# Patient Record
Sex: Male | Born: 1965 | Race: White | Hispanic: No | Marital: Married | Smoking: Never smoker
Health system: Southern US, Community
[De-identification: ages and names within clinical notes are randomized; demographics above are authoritative.]

## PROBLEM LIST (undated history)

## (undated) DIAGNOSIS — I499 Cardiac arrhythmia, unspecified: Secondary | ICD-10-CM

## (undated) HISTORY — PX: HERNIA REPAIR: SHX51

## (undated) HISTORY — DX: Cardiac arrhythmia, unspecified: I49.9

---

## 2008-12-22 ENCOUNTER — Emergency Department (HOSPITAL_COMMUNITY): Admission: EM | Admit: 2008-12-22 | Discharge: 2008-12-23 | Payer: Self-pay | Admitting: Emergency Medicine

## 2010-07-02 IMAGING — US US ART/VEN ABD/PELV/SCROTUM DOPPLER COMPLETE
1 series · 13 of 25 positions shown · non-contrast
Comparison: None.

CLINICAL DATA: 42-year-old male with scrotal pain for 2 days.
History of prior vasectomy.  Known right sided "cyst".

SCROTAL ULTRASOUND
DOPPLER ULTRASOUND OF THE TESTICLES
TECHNIQUE: Complete ultrasound examination of the testicles,
epididymis, and other scrotal structures was performed.  Color and
spectral Doppler ultrasound were also utilized to evaluate blood
flow to the testicles.

[Series 1: us art/ven abd/pelv/scrotum doppler complete · 0.07mm/px · 13 of 60 slices shown]
[im 1/60]
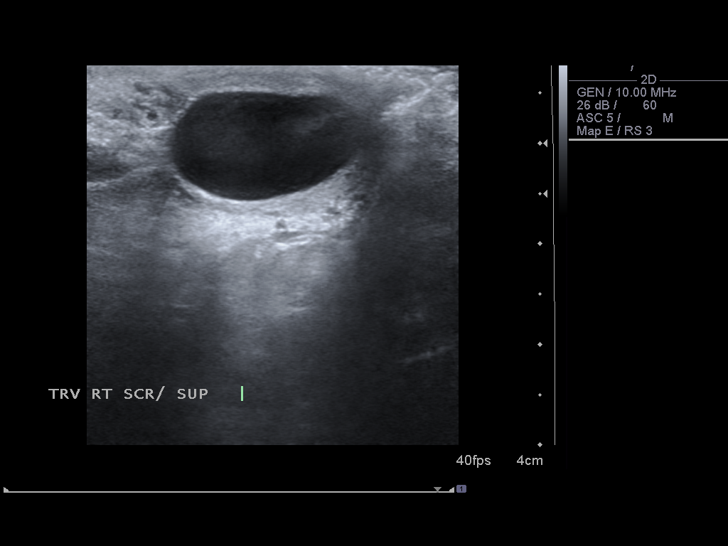
[im 5/60]
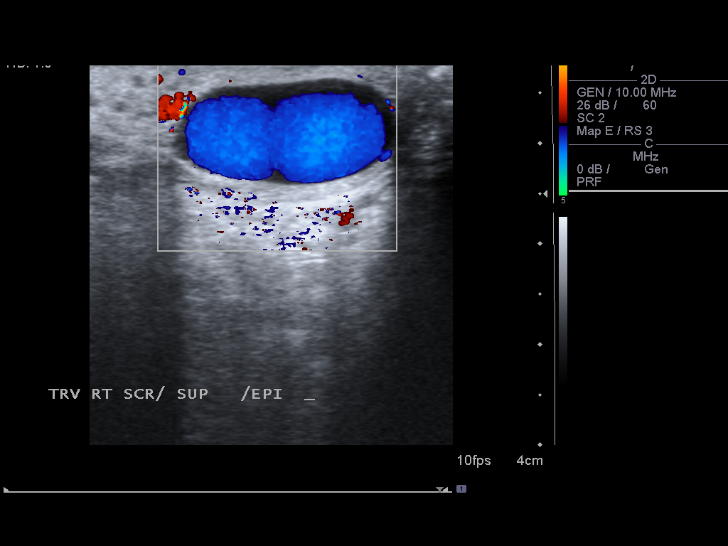
[im 10/60]
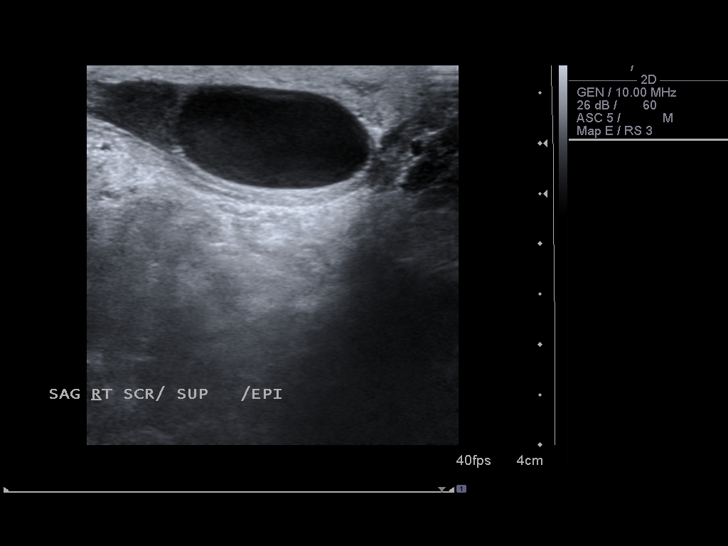
[im 15/60]
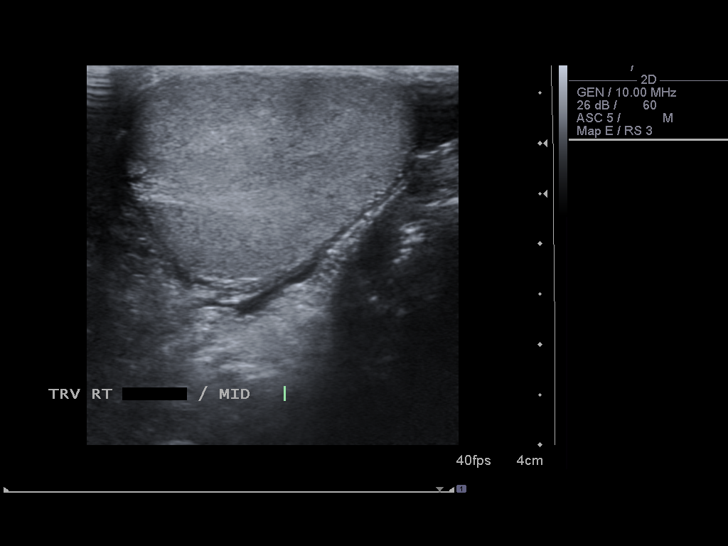
[im 20/60]
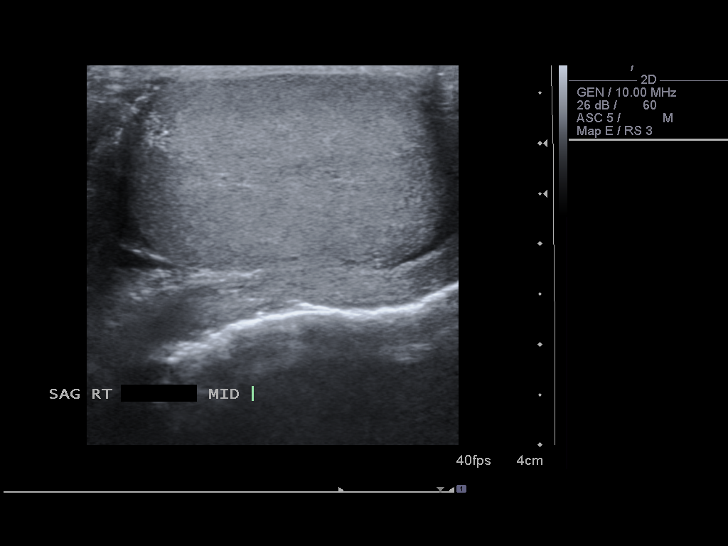
[im 25/60]
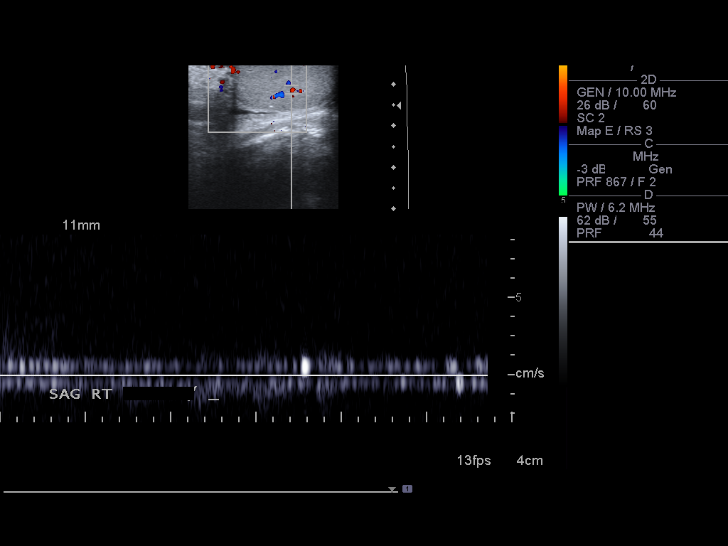
[im 30/60]
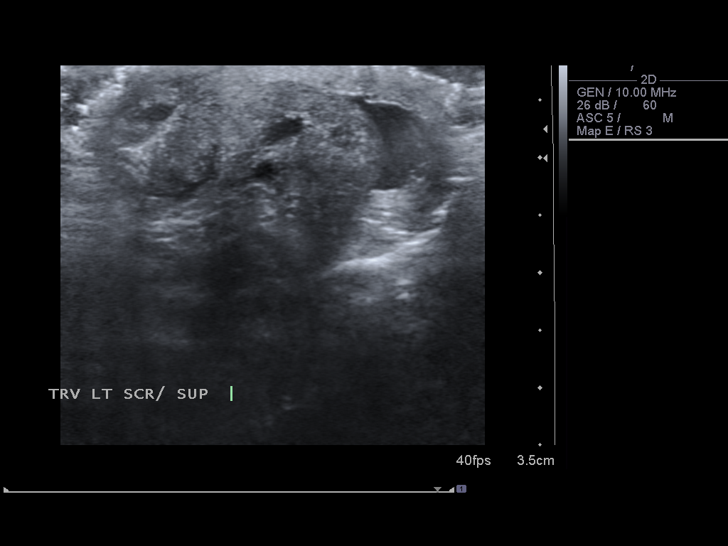
[im 35/60]
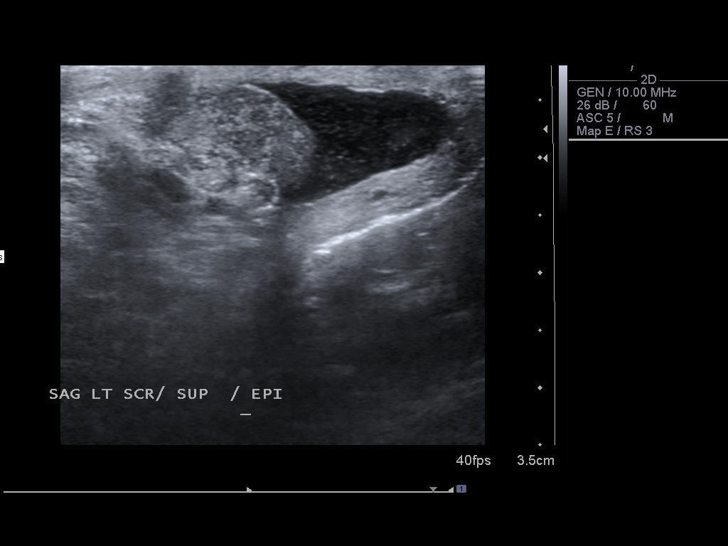
[im 40/60]
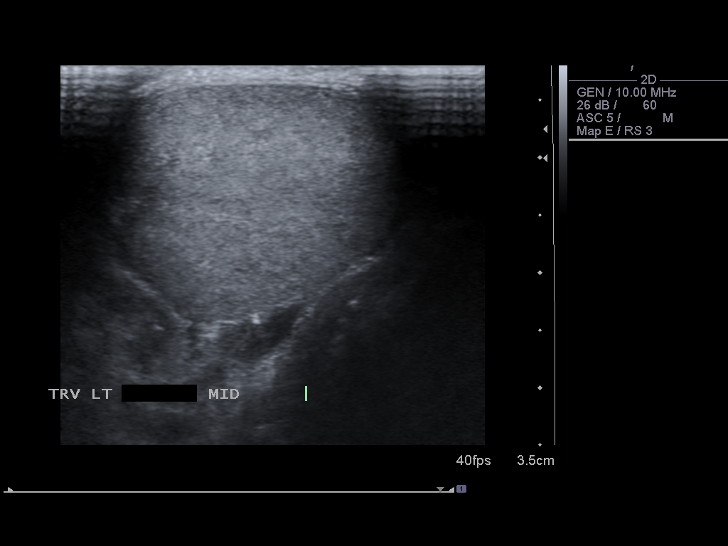
[im 45/60]
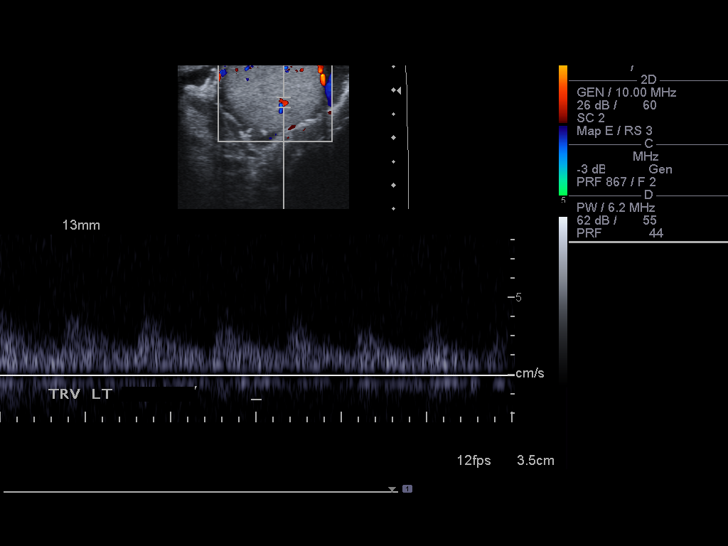
[im 50/60]
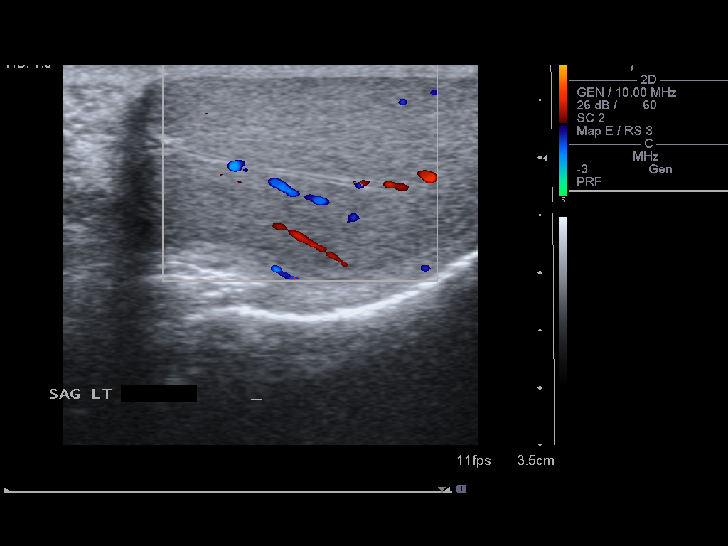
[im 55/60]
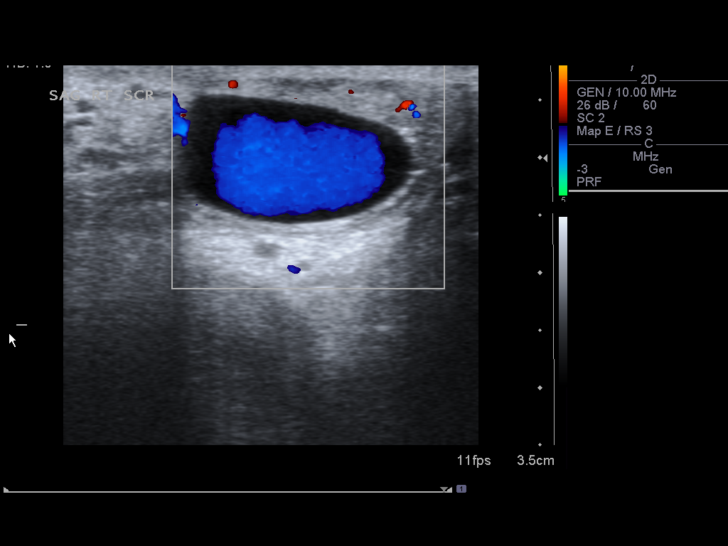
[im 60/60]
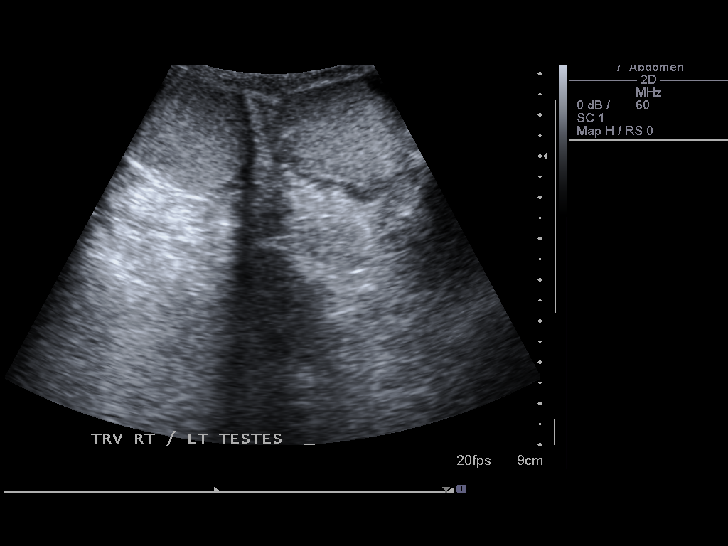

[13 of 25 positions shown; findings below may reference images not displayed]

FINDINGS: Bilateral testicular echotexture is within normal limits.
The right testicle measures 3.3 x 2.8 x 2.1 cm and the left
measures 3.3 x 2.5 x 2.0 cm.  Both testicles demonstrate normal
color Doppler flow and arterial, venous spectral wave forms.

The epididymi are within normal limits bilaterally.

There are bilateral complicated hydroceles, with echogenic mobile
debris seen during real time scanning.  The left hydrocele is
slightly larger than the right.

There is a circumscribed cystic appearing lesion adjacent to the
right testicle in the right hemi scrotum measuring 18 x 18 x 11 mm.
On color Doppler interrogation, this demonstrated a non pulsatile
flow.  Therefore, a large venous varix is favored.  No other
varices are evident.
IMPRESSION: 1.  No evidence of testicular torsion and normal testicular
echotexture.
2.  Complex left greater than right hydroceles/pyoceles with
echogenic debris.
3.  Unusual cystic lesion adjacent to the right testicle with
apparent venous flow.  Favor large venous varix.  According to the
patient, this abnormality has been seen on outside studies dating
back 6 years.  Comparison with those exams would be helpful.

## 2010-08-16 ENCOUNTER — Encounter: Payer: Self-pay | Admitting: Emergency Medicine

## 2010-11-02 LAB — URINALYSIS, ROUTINE W REFLEX MICROSCOPIC
Bilirubin Urine: NEGATIVE
Hgb urine dipstick: NEGATIVE
Ketones, ur: NEGATIVE mg/dL
Protein, ur: NEGATIVE mg/dL
Urobilinogen, UA: 0.2 mg/dL (ref 0.0–1.0)

## 2014-08-20 ENCOUNTER — Other Ambulatory Visit: Payer: Self-pay | Admitting: *Deleted

## 2014-08-20 ENCOUNTER — Encounter (HOSPITAL_BASED_OUTPATIENT_CLINIC_OR_DEPARTMENT_OTHER): Payer: Self-pay | Admitting: *Deleted

## 2014-08-20 ENCOUNTER — Emergency Department (HOSPITAL_BASED_OUTPATIENT_CLINIC_OR_DEPARTMENT_OTHER): Payer: BLUE CROSS/BLUE SHIELD

## 2014-08-20 ENCOUNTER — Emergency Department (HOSPITAL_BASED_OUTPATIENT_CLINIC_OR_DEPARTMENT_OTHER)
Admission: EM | Admit: 2014-08-20 | Discharge: 2014-08-20 | Disposition: A | Discharge status: Home / Self Care | Payer: BLUE CROSS/BLUE SHIELD | Attending: Emergency Medicine | Admitting: Emergency Medicine

## 2014-08-20 DIAGNOSIS — I499 Cardiac arrhythmia, unspecified: Secondary | ICD-10-CM

## 2014-08-20 DIAGNOSIS — I4891 Unspecified atrial fibrillation: Secondary | ICD-10-CM

## 2014-08-20 DIAGNOSIS — R079 Chest pain, unspecified: Secondary | ICD-10-CM | POA: Diagnosis present

## 2014-08-20 HISTORY — DX: Cardiac arrhythmia, unspecified: I49.9

## 2014-08-20 LAB — URINALYSIS, ROUTINE W REFLEX MICROSCOPIC
BILIRUBIN URINE: NEGATIVE
Glucose, UA: NEGATIVE mg/dL
Hgb urine dipstick: NEGATIVE
Ketones, ur: NEGATIVE mg/dL
LEUKOCYTES UA: NEGATIVE
Nitrite: NEGATIVE
PH: 6 (ref 5.0–8.0)
Protein, ur: NEGATIVE mg/dL
SPECIFIC GRAVITY, URINE: 1.019 (ref 1.005–1.030)
Urobilinogen, UA: 0.2 mg/dL (ref 0.0–1.0)

## 2014-08-20 LAB — COMPREHENSIVE METABOLIC PANEL
ALBUMIN: 4.4 g/dL (ref 3.5–5.2)
ALK PHOS: 38 U/L — AB (ref 39–117)
ALT: 13 U/L (ref 0–53)
AST: 24 U/L (ref 0–37)
Anion gap: 2 — ABNORMAL LOW (ref 5–15)
BUN: 20 mg/dL (ref 6–23)
CHLORIDE: 105 mmol/L (ref 96–112)
CO2: 28 mmol/L (ref 19–32)
CREATININE: 1.33 mg/dL (ref 0.50–1.35)
Calcium: 9 mg/dL (ref 8.4–10.5)
GFR calc Af Amer: 73 mL/min — ABNORMAL LOW (ref >90)
GFR, EST NON AFRICAN AMERICAN: 63 mL/min — AB (ref >90)
Glucose, Bld: 100 mg/dL — ABNORMAL HIGH (ref 70–99)
Potassium: 4 mmol/L (ref 3.5–5.1)
SODIUM: 135 mmol/L (ref 135–145)
TOTAL PROTEIN: 7 g/dL (ref 6.0–8.3)
Total Bilirubin: 0.9 mg/dL (ref 0.3–1.2)

## 2014-08-20 LAB — CBC WITH DIFFERENTIAL/PLATELET
Basophils Absolute: 0 10*3/uL (ref 0.0–0.1)
Basophils Relative: 0 % (ref 0–1)
Eosinophils Absolute: 0.1 10*3/uL (ref 0.0–0.7)
Eosinophils Relative: 1 % (ref 0–5)
HEMATOCRIT: 48.4 % (ref 39.0–52.0)
HEMOGLOBIN: 16.4 g/dL (ref 13.0–17.0)
LYMPHS ABS: 1.4 10*3/uL (ref 0.7–4.0)
LYMPHS PCT: 24 % (ref 12–46)
MCH: 31.7 pg (ref 26.0–34.0)
MCHC: 33.9 g/dL (ref 30.0–36.0)
MCV: 93.4 fL (ref 78.0–100.0)
MONO ABS: 0.5 10*3/uL (ref 0.1–1.0)
Monocytes Relative: 8 % (ref 3–12)
NEUTROS PCT: 67 % (ref 43–77)
Neutro Abs: 3.9 10*3/uL (ref 1.7–7.7)
PLATELETS: 264 10*3/uL (ref 150–400)
RBC: 5.18 MIL/uL (ref 4.22–5.81)
RDW: 11.9 % (ref 11.5–15.5)
WBC: 5.8 10*3/uL (ref 4.0–10.5)

## 2014-08-20 LAB — TROPONIN I

## 2014-08-20 LAB — RAPID URINE DRUG SCREEN, HOSP PERFORMED
AMPHETAMINES: NOT DETECTED
BARBITURATES: NOT DETECTED
Benzodiazepines: NOT DETECTED
COCAINE: NOT DETECTED
OPIATES: NOT DETECTED
Tetrahydrocannabinol: NOT DETECTED

## 2014-08-20 LAB — PROTIME-INR
INR: 0.95 (ref 0.00–1.49)
Prothrombin Time: 12.7 seconds (ref 11.6–15.2)

## 2014-08-20 LAB — TSH: TSH: 1.201 u[IU]/mL (ref 0.350–4.500)

## 2014-08-20 LAB — T4, FREE: FREE T4: 1.15 ng/dL (ref 0.80–1.80)

## 2014-08-20 MED ORDER — DILTIAZEM HCL ER COATED BEADS 120 MG PO CP24: 120.0000 mg | ORAL_CAPSULE | Freq: Once | ORAL | Status: DC

## 2014-08-20 MED ORDER — DILTIAZEM HCL 100 MG IV SOLR
5.0000 mg/h | INTRAVENOUS | Status: DC
  Administered 2014-08-20: 5 mg/h via INTRAVENOUS

## 2014-08-20 MED ORDER — FLECAINIDE ACETATE 50 MG PO TABS
ORAL_TABLET | ORAL | Status: AC
  Filled 2014-08-20: qty 6

## 2014-08-20 MED ORDER — DILTIAZEM HCL ER COATED BEADS 120 MG PO CP24
120.0000 mg | ORAL_CAPSULE | Freq: Once | ORAL | Status: AC
  Administered 2014-08-20: 120 mg via ORAL
  Filled 2014-08-20: qty 1

## 2014-08-20 MED ORDER — ASPIRIN 81 MG PO CHEW: 324.0000 mg | CHEWABLE_TABLET | Freq: Every day | ORAL | Status: DC

## 2014-08-20 MED ORDER — ENOXAPARIN SODIUM 100 MG/ML ~~LOC~~ SOLN
1.0000 mg/kg | Freq: Once | SUBCUTANEOUS | Status: AC
  Administered 2014-08-20: 85 mg via SUBCUTANEOUS
  Filled 2014-08-20: qty 1

## 2014-08-20 MED ORDER — FLECAINIDE ACETATE 100 MG PO TABS
300.0000 mg | ORAL_TABLET | Freq: Once | ORAL | Status: AC
  Administered 2014-08-20: 300 mg via ORAL

## 2014-08-20 MED ORDER — ASPIRIN 325 MG PO TABS
325.0000 mg | ORAL_TABLET | Freq: Once | ORAL | Status: AC
  Administered 2014-08-20: 325 mg via ORAL
  Filled 2014-08-20: qty 1

## 2014-08-20 NOTE — Discharge Instructions (Signed)

## 2014-08-20 NOTE — ED Provider Notes (Signed)
CSN: [id number]     Arrival date & time 08/20/14  0825 History   First MD Initiated Contact with Patient 08/20/14 [phone]     Chief Complaint  Patient presents with  . Chest Pain     (Consider location/radiation/quality/duration/timing/severity/associated sxs/prior Treatment) HPI The patient reports that intermittently for about 2 weeks he has noticed some fluttering sensation in his chest. He reports that it usually resolve spontaneously. He reports he had an episode that started this morning at about 3 AM and with that minimal amount of activity he felt that his heart was racing in his chest. He reports that he works out quite a bit and it will be very unusual for him to get winded or to experience heart racing from such minimal activity. He reports that has continued now throughout the morning with any type of activity he can feel his heart fluttering and racing. He denies any chest pain or shortness of breath. He denies any syncopal episode. The patient is very physically active doing running and exercise regularly. He does use several nutritional supplements however he denies he uses any that contain stimulants. He does not have the containers with him he reports one of them is only a dehydrated vegetable and fruit supplement. He reports he otherwise that he takes has some creatine and it but no stimulants and that he has not taken it for 5 days. He reports he usually drinks 2-3 cups of coffee a day. He reports he has had no coffee or tea today. He has no prior diagnosed H of fibrillation. He reports his father developed atrial fibrillation in his later 3150s and has congestive heart failure. The patient has been very healthy, he reports he does not go to the doctor and takes no medications. History reviewed. No pertinent past medical history. History reviewed. No pertinent past surgical history. History reviewed. No pertinent family history. History  Substance Use Topics  . Smoking status: Never  Smoker   . Smokeless tobacco: Not on file  . Alcohol Use: Not on file    Review of Systems 10 Systems reviewed and are negative for acute change except as noted in the HPI.    Allergies  Review of patient's allergies indicates no known allergies.  Home Medications   Prior to Admission medications   Not on File   BP 123/82 mmHg  Pulse 104  Temp(Src) 98.1 F (36.7 C) (Oral)  Resp 16  Ht 5\' 9"  (1.753 m)  Wt 190 lb (86.183 kg)  BMI 28.05 kg/m2  SpO2 100% Physical Exam  Constitutional: He is oriented to person, place, and time. He appears well-developed and well-nourished.  HENT:  Head: Normocephalic and atraumatic.  Eyes: EOM are normal. Pupils are equal, round, and reactive to light.  Neck: Neck supple.  Cardiovascular: Normal heart sounds and intact distal pulses.  Exam reveals no friction rub.   No murmur heard. Heart rate is irregularly irregular. The rate on the monitor ranges from 90-140 BPM.  Pulmonary/Chest: Effort normal and breath sounds normal.  Abdominal: Soft. Bowel sounds are normal. He exhibits no distension. There is no tenderness.  Musculoskeletal: Normal range of motion. He exhibits no edema.  Neurological: He is alert and oriented to person, place, and time. He has normal strength. Coordination normal. GCS eye subscore is 4. GCS verbal subscore is 5. GCS motor subscore is 6.  Skin: Skin is warm, dry and intact.  Psychiatric: He has a normal mood and affect.    ED Course  Procedures (including critical care time) Labs Review Labs Reviewed  COMPREHENSIVE METABOLIC PANEL  TROPONIN I  CBC WITH DIFFERENTIAL/PLATELET  PROTIME-INR  URINALYSIS, ROUTINE W REFLEX MICROSCOPIC  URINE RAPID DRUG SCREEN (HOSP PERFORMED)  TSH  T4, FREE    Imaging Review No results found.   EKG Interpretation None     Consult: Dr. Shelda Jakes of cardiology rises to allow the diltiazem drip to infuse for 30 minutes, administer Lovenox as planned, then administer flecainide  300 mg. If the patient converts he can be discharged on Cardizem CD 120 mg daily and a daily aspirin.  13:45 the patient has converted to a sinus rhythm. He is now asymptomatic.  CRITICAL CARE Performed by: Arby Barrette   Total critical care time: 60  Critical care time was exclusive of separately billable procedures and treating other patients.  Critical care was necessary to treat or prevent imminent or life-threatening deterioration.  Critical care was time spent personally by me on the following activities: development of treatment plan with patient and/or surrogate as well as nursing, discussions with consultants, evaluation of patient's response to treatment, examination of patient, obtaining history from patient or surrogate, ordering and performing treatments and interventions, ordering and review of laboratory studies, ordering and review of radiographic studies, pulse oximetry and re-evaluation of patient's condition. MDM   Final diagnoses:  Atrial fibrillation with rapid ventricular response   The patient is healthy and well in appearance. He has no known other medical history. The patient has new onset atrial fibrillation symptomatic with a fluttering sensation, no syncope and no dyspnea. The patient's case was reviewed with Dr. Sherlie Ban. Per consultation with Dr. Sherlie Ban the patient has been treated with flecainide. He has converted to normal sinus rhythm. The patient will then be continued on a daily aspirin and Cardizem CD 120. He is to be contacted by cardiology for a follow-up appointment. He is counseled on the need to return if there should be any concerning symptoms or changes.    Arby Barrette, MD 08/20/14 [phone]

## 2014-08-20 NOTE — ED Notes (Signed)
Pt amb to room 10 with emt, reports "chest fluttering" off and on x 2 weeks, had an episode onset 3am, noticed when he got up to use the bathroom, lasted longer than usual so came here for eval. Pt denies any sob, chest tightness or pressure or any other c/o.

## 2014-08-20 NOTE — ED Notes (Signed)
MD at bedside. 

## 2014-08-22 ENCOUNTER — Ambulatory Visit (HOSPITAL_COMMUNITY)
Admission: RE | Admit: 2014-08-22 | Discharge: 2014-08-22 | Disposition: A | Discharge status: Home / Self Care | Payer: BLUE CROSS/BLUE SHIELD | Source: Ambulatory Visit | Attending: Cardiovascular Disease | Admitting: Cardiovascular Disease

## 2014-08-22 DIAGNOSIS — I4891 Unspecified atrial fibrillation: Secondary | ICD-10-CM

## 2014-08-22 NOTE — Progress Notes (Signed)
2D Echocardiogram Complete.  08/22/2014   Germani Gavilanes, RDCS  

## 2014-08-25 ENCOUNTER — Encounter: Payer: Self-pay | Admitting: Cardiovascular Disease

## 2014-08-25 ENCOUNTER — Ambulatory Visit (INDEPENDENT_AMBULATORY_CARE_PROVIDER_SITE_OTHER): Payer: BLUE CROSS/BLUE SHIELD | Admitting: Cardiovascular Disease

## 2014-08-25 VITALS — BP 136/80 | HR 66 | Ht 69.0 in | Wt 194.8 lb

## 2014-08-25 DIAGNOSIS — I4891 Unspecified atrial fibrillation: Secondary | ICD-10-CM

## 2014-08-25 DIAGNOSIS — R0681 Apnea, not elsewhere classified: Secondary | ICD-10-CM

## 2014-08-25 DIAGNOSIS — I480 Paroxysmal atrial fibrillation: Secondary | ICD-10-CM

## 2014-08-25 DIAGNOSIS — R0683 Snoring: Secondary | ICD-10-CM

## 2014-08-25 MED ORDER — DILTIAZEM HCL ER COATED BEADS 120 MG PO CP24: 120.0000 mg | ORAL_CAPSULE | Freq: Once | ORAL | Status: DC

## 2014-08-25 NOTE — Patient Instructions (Signed)
Your physician has recommended that you have a sleep study. This test records several body functions during sleep, including: brain activity, eye movement, oxygen and carbon dioxide blood levels, heart rate and rhythm, breathing rate and rhythm, the flow of air through your mouth and nose, snoring, body muscle movements, and chest and belly movement. [facility]. THEY WILL CALL YOU WITH THE APPOINTMENT DATE AND TIME.  Your physician recommends that you schedule a follow-up appointment in: 3 months.

## 2014-08-27 DIAGNOSIS — I480 Paroxysmal atrial fibrillation: Secondary | ICD-10-CM | POA: Insufficient documentation

## 2014-08-27 NOTE — Progress Notes (Signed)
Patient ID: IRVIN BASTIN, male   DOB: 1966-06-18, 49 y.o.   MRN: [id number]     PATIENT PROFILE: MARSDEN ZAINO is a 49 y.o. male who presents to the office today to establish cardiology care with me after he was recently evaluated at [facility] with new onset paroxysmal atrial fibrillation.   HPI:  DONI WIDMER is a 49 y.o. male who denies any prior cardiac history.  He remains very active, exercises regularly, often doing sanity workouts at home after work.  Last week, he admits to having increased work-related stress.  On January 20 7 PM he had an episode of fast heartbeat at a proximally 3 AM in the morning.  His heart rhythm was irregular.  He went to the bathroom.  He ultimately went back to sleep.  However, upon awakening, he still noted that his pulse was irregularly irregular and beating fast.  He typically drinks 2-3 cups of coffee per day but denied any significant excess caffeine use that particular evening.  He did exercise late in the evening.  He presented to the emergency room at [facility] and his pulse on physical exam was noted to be 104.  His blood pressure was normal.  He was afebrile.  His heart rate on the monitor range from 90-140 bpm..  Laboratory was all normal including urinalysis, drug screen, TSH at 1.2, comprehensive metabolic panel, troponin, and CBC.  Free T4 was also normal at 1.15.  He was treated initially with IV diltiazem and it appears that he received 1 oral dose of 300 mg a flutter night, which ultimately pharmacologically cardioverted him back to sinus rhythm.  I had recently met his wife who works at [employer].  She had contacted me regarding her husbands presentation.  I arrange for an echo Doppler study to be done on 08/22/2014 and he presents to the office today for follow-up evaluation.  His echo Doppler study revealed entirely normal systolic and diastolic function without wall motion abnormalities.  Ejection fraction was 55-60%.  The  right atrium was interpreted to be mildly dilated.  He had normal valvular architecture.  Since the emergency room evaluation.  He has been taking 4 chewable aspirins daily in addition to Cardizem CD 120 mg.  He presents for evaluation.  Upon further questioning, the patient states that at times he is awakened with a sensation of gasping for breath.  He does snore.  His sleep is nonrestorative.  His sleep hygiene is less than optimal.  Past Medical History  Diagnosis Date  . Arrhythmia 08/20/14    afib    No past surgical history on file.  No Known Allergies  Current Outpatient Prescriptions  Medication Sig Dispense Refill  . aspirin 81 MG chewable tablet Chew 4 tablets (324 mg total) by mouth daily. 30 tablet 0  . diltiazem (CARDIZEM CD) 120 MG 24 hr capsule Take 1 capsule (120 mg total) by mouth once. 90 capsule 3   No current facility-administered medications for this visit.    Socially he is married to [employer].  He is married for 18 years.  He attended college at [employer].  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  He has 2 children, ages 73 and 1.  He exercises regularly and does participate in races.  He does drink occasional beer.  Family History  Problem Relation Age of Onset  . Thyroid disease Mother   . Heart failure Father   .  Atrial fibrillation Father   . Lung disease Maternal Grandmother   . Cancer Maternal Grandfather   . Cancer Paternal Grandmother   . Atrial fibrillation Paternal Grandfather   . Cancer Paternal Grandfather    Additional family history is notable that both parents are still living, mother is 21 and has thyroid issues, his father is 65 and has a history of atrial fibrillation and congestive heart failure.  He has 2 brothers, ages 68 and 40 and are alive and well without medical problems.  ROS General: Negative; No fevers, chills, or night sweats HEENT: Negative; No changes in vision or hearing, sinus congestion, difficulty  swallowing Pulmonary: Negative; No cough, wheezing, shortness of breath, hemoptysis Cardiovascular:  See HPI; No chest pain, presyncope, syncope, palpitations, edema GI: Negative; No nausea, vomiting, diarrhea, or abdominal pain GU: Negative; No dysuria, hematuria, or difficulty voiding Musculoskeletal: Negative; no myalgias, joint pain, or weakness Hematologic/Oncologic: Negative; no easy bruising, bleeding Endocrine: Negative; no heat/cold intolerance; no diabetes Neuro: Negative; no changes in balance, headaches Skin: Negative; No rashes or skin lesions Psychiatric: Negative; No behavioral problems, depression Sleep: Positive for snoring and at several times he has awakened gasping for breath; No daytime sleepiness, hypersomnolence, bruxism, restless legs, hypnogagnic hallucinations Other comprehensive 14 point system review is negative   Physical Exam BP 136/80 mmHg  Pulse 66  Ht 5' 9" (1.753 m)  Wt 194 lb 12.8 oz (88.361 kg)  BMI 28.75 kg/m2 General: Alert, oriented, no distress.  Healthy-appearing in good physical shape but obviously nervous.  Skin: normal turgor, no rashes, warm and dry HEENT: Normocephalic, atraumatic. Pupils equal round and reactive to light; sclera anicteric; extraocular muscles intact; Fundi normal without hemorrhages or exudates Nose without nasal septal hypertrophy Mouth/Parynx benign; Mallinpatti scale 2/3 Neck: No JVD, no carotid bruits; normal carotid upstroke Lungs: clear to ausculatation and percussion; no wheezing or rales Chest wall: without tenderness to palpitation Heart: PMI not displaced, RRR, s1 s2 normal, no systolic murmur, no diastolic murmur, no rubs, gallops, thrills, or heaves Abdomen: soft, nontender; no hepatosplenomehaly, BS+; abdominal aorta nontender and not dilated by palpation. Back: no CVA tenderness Pulses 2+ Musculoskeletal: full range of motion, normal strength, no joint deformities Extremities: no clubbing cyanosis or  edema, Homan's sign negative  Neurologic: grossly nonfocal; Cranial nerves grossly wnl Psychologic: Normal mood and affect   ECG (independently read by me): Normal sinus rhythm at 66 bpm with mild RV conduction delay.  QTc interval 410 ms.  Nondiagnostic T changes in lead 3.  LABS:  BMET    Component Value Date/Time   NA 135 08/20/2014 0830   K 4.0 08/20/2014 0830   CL 105 08/20/2014 0830   CO2 28 08/20/2014 0830   GLUCOSE 100* 08/20/2014 0830   BUN 20 08/20/2014 0830   CREATININE 1.33 08/20/2014 0830   CALCIUM 9.0 08/20/2014 0830   GFRNONAA 63* 08/20/2014 0830   GFRAA 73* 08/20/2014 0830     Hepatic Function Panel     Component Value Date/Time   PROT 7.0 08/20/2014 0830   ALBUMIN 4.4 08/20/2014 0830   AST 24 08/20/2014 0830   ALT 13 08/20/2014 0830   ALKPHOS 38* 08/20/2014 0830   BILITOT 0.9 08/20/2014 0830     CBC    Component Value Date/Time   WBC 5.8 08/20/2014 0830   RBC 5.18 08/20/2014 0830   HGB 16.4 08/20/2014 0830   HCT 48.4 08/20/2014 0830   PLT 264 08/20/2014 0830   MCV 93.4 08/20/2014 0830   MCH  31.7 08/20/2014 0830   MCHC 33.9 08/20/2014 0830   RDW 11.9 08/20/2014 0830   LYMPHSABS 1.4 08/20/2014 0830   MONOABS 0.5 08/20/2014 0830   EOSABS 0.1 08/20/2014 0830   BASOSABS 0.0 08/20/2014 0830     BNP No results found for: BNP  ProBNP No results found for: PROBNP   Lipid Panel  No results found for: CHOL, TRIG, HDL, CHOLHDL, VLDL, LDLCALC, LDLDIRECT    RADIOLOGY: Dg Chest 2 View  08/20/2014   CLINICAL DATA:  Flutter heartbeat since 3 a.m. Symptoms have been intermittent for 2 weeks. Nonsmoker.  EXAM: CHEST  2 VIEW  COMPARISON:  None.  FINDINGS: The heart size and mediastinal contours are within normal limits. Both lungs are clear. The visualized skeletal structures are unremarkable.  IMPRESSION: No active cardiopulmonary disease.   Electronically Signed   By: Markus Daft M.D.   On: 08/20/2014 09:14     ASSESSMENT AND PLAN: Mr. Minar  is a healthy-appearing 49 year old active gentleman without any prior known cardiac history who did not on any medications.  He did recently developed an episode of rapid atrial fibrillation at a proximally 3 AM in the morning which ultimately  was successfully pharmacologically cardioverted with combination diltiazem and a dose of 300 mg of oral flecanide.  Sleep history is suggestive of possible underlying sleep apnea which may have played a role in his nocturnal atrial fibrillation development.  He also had exercised rigorously just prior to going to bed.  He denies any recent pseudoephedrine use or stimulants.  I reviewed his echo Doppler data.  I personally reviewed the [facility] records.  I reviewed his laboratory in detail.  Presently, I have recommended he decrease his aspirin to 81 mg.  His chads2vasc2 score is 0.  I suspect he will not require anticoagulation therapy.  His recent duration of atrial fibrillation was less than 10 hours.  I have discussed alcohol.  I am scheduling him for a diagnostic polysomnogram for further evaluation of potential sleep apnea.  He will continue his current dose of Cardizem CD 120 mg and I will see him in the office for follow-up evaluation in 3 months and further recommendations will be made at that time.  Time spent: 45 minutes  Troy Sine, MD, [facility] 08/27/2014 6:37 PM

## 2014-09-22 ENCOUNTER — Telehealth: Payer: Self-pay | Admitting: Cardiovascular Disease

## 2014-09-22 MED ORDER — DILTIAZEM HCL ER COATED BEADS 120 MG PO CP24: 120.0000 mg | ORAL_CAPSULE | Freq: Once | ORAL | Status: DC

## 2014-09-22 NOTE — Telephone Encounter (Signed)
Spoke with Misty StanleyLisa, patient's wife. She states pharmacy has no record of Rx on file. Called pharmacy and they do not have prescription. V/O for diltiazem (cardizem CD) 120mg  24 hr capsule given #90 with 3 refills

## 2014-09-22 NOTE — Telephone Encounter (Signed)
°  1. Which medications need to be refilled? Diltiazem-new prescription  2. Which pharmacy is medication to be sent to?[phone]  3. Do they need a 30 day or 90 day supply? 90  4. Would they like a call back once the medication has been sent to the pharmacy? yes

## 2014-11-05 ENCOUNTER — Ambulatory Visit (HOSPITAL_BASED_OUTPATIENT_CLINIC_OR_DEPARTMENT_OTHER): Payer: BLUE CROSS/BLUE SHIELD | Attending: Cardiovascular Disease | Admitting: Radiology

## 2014-11-05 VITALS — Ht 69.0 in | Wt 186.0 lb

## 2014-11-05 DIAGNOSIS — I480 Paroxysmal atrial fibrillation: Secondary | ICD-10-CM | POA: Diagnosis not present

## 2014-11-05 DIAGNOSIS — G4733 Obstructive sleep apnea (adult) (pediatric): Secondary | ICD-10-CM | POA: Insufficient documentation

## 2014-11-05 DIAGNOSIS — R0683 Snoring: Secondary | ICD-10-CM

## 2014-11-05 DIAGNOSIS — R0681 Apnea, not elsewhere classified: Secondary | ICD-10-CM

## 2014-11-09 NOTE — Sleep Study (Signed)
   NAME: Ricky Arnold DATE OF BIRTH:  05/04/1966 MEDICAL RECORD NUMBER [id number]  LOCATION: [facility]  PHYSICIAN: KELLY,THOMAS A  DATE OF STUDY: 11/05/2014  SLEEP STUDY TYPE: Nocturnal Polysomnogram               REFERRING PHYSICIAN: Lennette BihariKelly, Thomas A, MD  INDICATION FOR STUDY:  Mr. [name] is a 49 year old male who developed a recent episode of fibrillation with rapid ventricular response.  The patient has a history of snoring and at times he is awakened with a sensation of gasping for breath.  His sleep is nonrestorative.  He is referred for a sleep study to evaluate the possibility of obstructive sleep apnea.  EPWORTH SLEEPINESS SCORE:  4 HEIGHT: 5\' 9"  (175.3 cm)  WEIGHT: 186 lb (84.369 kg)    Body mass index is 27.45 kg/(m^2).  NECK SIZE: 16 in.  MEDICATIONS:  diltiazem (CARDIZEM CD) 120 MG 24 hr capsule 120 mg, Once aspirin    SLEEP ARCHITECTURE:  The patient slept for 293 minutes out of a total recording time of 371.5 minutes and sleep period of time of 306.5 minutes.  Percent sleep efficiency is 78.9%.  Latency to sleep onset was normal at 15.5 minutes.  Latency to REM sleep was normal at 105 minutes.  The patient slept 12.5 minutes (4.3%) in stage I, 249 minutes (85%) in stage II, 0.5 minutes (0.2%) in stage III, and 31 minutes (10.6%) in REM sleep.  Supine sleep was achieved and accounted for 57.5% of the night of which 4.4% with supine REM sleep.  There were total of 41 arousals with an index of 8.4.  RESPIRATORY DATA:  During the sleep period of time, there were 0 obstructive apneas, 1 central apnea, 0 mixed apneas, and 5 hypopneas.  The apnea hypopnea index (AHI) was 1.2/hr and the respiratory disturbance index (RDI) was 1.4/hr.  There was mild to moderate snoring.  OXYGEN DATA:  The baseline oxygen saturation was 92%.  The lowest oxygen saturation in non-REM sleep was 91% and in REM sleep was 89%.  CARDIAC DATA:  The patient was in sinus  rhythm with an average heart rate at 53 bpm.  There were isolated PACs and PVCs noted.  MOVEMENT/PARASOMNIA:  There were 0 periodic limb movements.  IMPRESSION/ RECOMMENDATION:   Very mild increased upper airway resistance without evidence for sleep apnea or significant sleep disordered breathing. Mild to moderate snoring. Reduction in REM sleep duration.  At present, there is no indication for CPAP therapy. Effort should be made to optimize nasal and oral pharyngeal patency. The patient should be counseled in good sleep hygiene procedures. Consider alternatives for the treatment of mild to moderate snoring.    Lennette BihariKELLY,THOMAS A Diplomate, American Board of Sleep Medicine  ELECTRONICALLY SIGNED ON:  11/09/2014, 11:24 AM [facility] PH: [phone]   FX: [phone] ACCREDITED BY THE AMERICAN ACADEMY OF SLEEP MEDICINE

## 2014-11-09 NOTE — Addendum Note (Signed)
Addended by: Nicki GuadalajaraKELLY, THOMAS A on: 11/09/2014 11:36 AM   Modules accepted: Level of Service

## 2014-11-11 ENCOUNTER — Encounter: Payer: Self-pay | Admitting: *Deleted

## 2014-11-11 ENCOUNTER — Telehealth: Payer: Self-pay | Admitting: *Deleted

## 2014-11-11 NOTE — Telephone Encounter (Signed)
Called patient to inform him of negative sleep study results. Cell/ home # unavailable. Message saying "voice mail not set up." work # says "number disconnected."  I will send patient a note.

## 2014-12-01 ENCOUNTER — Encounter: Payer: Self-pay | Admitting: Cardiovascular Disease

## 2014-12-01 ENCOUNTER — Ambulatory Visit (INDEPENDENT_AMBULATORY_CARE_PROVIDER_SITE_OTHER): Payer: BLUE CROSS/BLUE SHIELD | Admitting: Cardiovascular Disease

## 2014-12-01 VITALS — BP 122/64 | HR 56 | Ht 69.0 in | Wt 196.9 lb

## 2014-12-01 DIAGNOSIS — I480 Paroxysmal atrial fibrillation: Secondary | ICD-10-CM | POA: Diagnosis not present

## 2014-12-01 MED ORDER — METOPROLOL TARTRATE 50 MG PO TABS: ORAL_TABLET | ORAL | Status: DC

## 2014-12-01 NOTE — Progress Notes (Signed)
Patient ID: Ricky Arnold, male   DOB: Oct 07, 1965, 48 y.o.   MRN: [id number]     HPI: [name] is a 49 y.o. male who presents to the office today for follow-up evaluation of his  episode of atrial fibrillation.  I had seen him for initial evaluation on 08/25/2014.    [name]  denies any prior cardiac history.  He remains very active, exercises regularly, often doing insanity workouts at home after work.   In late January he hadincreased work-related stress.  On January 27 PM he had an episode of fast heartbeat at 3 AM in the morning.  His heart rhythm was irregular.  He went to the bathroom.  He ultimately went back to sleep.  However, upon awakening, he still noted that his pulse was irregularly irregular and beating fast.  He typically was drinking 2-3 cups of caffeine per day..  On that particular night after coming home from work and prior to doing an intensive workout.  He had a circumflex energy drink, which contains significant amount of caffeine and also had taken man cold.  He exercised aggressively late in the evening.  He presented to the emergency room at [facility] and his pulse on physical exam was noted to be 104.  His blood pressure was normal.  He was afebrile.  His heart rate on the monitor range from 90-140 bpm..  Laboratory was all normal including urinalysis, drug screen, TSH at 1.2, comprehensive metabolic panel, troponin, and CBC.  Free T4 was also normal at 1.15.  He was treated initially with IV diltiazem and received 1 oral dose of flecanide 300 mg  which  pharmacologically cardioverted him back to sinus rhythm.  I had recently met his wife who works at [employer].  She had contacted me regarding her husbands presentation. An echo Doppler study was done on 08/22/2014 and he saw me on February 1 for initial evaluation.  His echo Doppler study revealed entirely normal systolic and diastolic function without wall motion abnormalities.  Ejection fraction was  55-60%.  The right atrium was interpreted to be mildly dilated.  He had normal valvular architecture. He has been on Cardizem CD  120 mg since that time in addition to baby aspirin.  He denies any awareness of recurrent atrial fibrillation.  He has not had any caffeine use and has not taken any of the  stimulant drinks prior to any of his cardiac workouts.  Due to concerns for possible sleep apnea with rare occurrences of awakening gasping for breath and snoring.  He was referred for diagnostic polysomnogram. This was done at [facility] on 11/05/2014.  He did not have obstructive sleep apnea but  There was suggestion of probable very mild increased upper airway resistance.  There was mild-to-moderate snoring.  He had reduction in REM sleep duration.  Since he has been off caffeine and have not been using stimulus drinks, he is now sleeping better. Sleep is restorative.  He is unaware of any recurrent episodes of palpitations. He was questioning if he would need to stay on this medication indefinitely or if in fact he could be taken off his Cardizem.    Past Medical History  Diagnosis Date  . Arrhythmia 08/20/14    afib    History reviewed. No pertinent past surgical history.  No Known Allergies  Current Outpatient Prescriptions  Medication Sig Dispense Refill  . aspirin 81 MG chewable tablet Chew 4 tablets (324 mg  total) by mouth daily. 30 tablet 0  . diltiazem (CARDIZEM CD) 120 MG 24 hr capsule Take 1 capsule (120 mg total) by mouth once. 90 capsule 3  . metoprolol (LOPRESSOR) 50 MG tablet Take 1/2-1 tablet as needed for palptations 30 tablet 3   No current facility-administered medications for this visit.    Socially he is married to [employer].  He is married for 18 years.  He attended college at [employer].  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  He has 2 children, ages 62 and 77.  He exercises regularly and does participate in races.  He does drink occasional  beer.  Family History  Problem Relation Age of Onset  . Thyroid disease Mother   . Heart failure Father   . Atrial fibrillation Father   . Lung disease Maternal Grandmother   . Cancer Maternal Grandfather   . Cancer Paternal Grandmother   . Atrial fibrillation Paternal Grandfather   . Cancer Paternal Grandfather    Additional family history is notable that both parents are still living, mother is 25 and has thyroid issues, his father is 92 and has a history of atrial fibrillation and congestive heart failure.  He has 2 brothers, ages 51 and 54 and are alive and well without medical problems.  ROS General: Negative; No fevers, chills, or night sweats HEENT: Negative; No changes in vision or hearing, sinus congestion, difficulty swallowing Pulmonary: Negative; No cough, wheezing, shortness of breath, hemoptysis Cardiovascular:  See HPI; No chest pain, presyncope, syncope, palpitations, edema GI: Negative; No nausea, vomiting, diarrhea, or abdominal pain GU: Negative; No dysuria, hematuria, or difficulty voiding Musculoskeletal: Negative; no myalgias, joint pain, or weakness Hematologic/Oncologic: Negative; no easy bruising, bleeding Endocrine: Negative; no heat/cold intolerance; no diabetes Neuro: Negative; no changes in balance, headaches Skin: Negative; No rashes or skin lesions Psychiatric: Negative; No behavioral problems, depression Sleep: Positive for snoring and at several times he has awakened gasping for breath; No daytime sleepiness, hypersomnolence, bruxism, restless legs, hypnogagnic hallucinations Other comprehensive 14 point system review is negative   Physical Exam BP 122/64 mmHg  Pulse 56  Ht _0  (1.753 m)  Wt 196 lb 14.4 oz (89.313 kg)  BMI 29.06 kg/m2 General: Alert, oriented, no distress.  Healthy-appearing in good physical shape but obviously nervous.  Skin: normal turgor, no rashes, warm and dry HEENT: Normocephalic, atraumatic. Pupils equal round and  reactive to light; sclera anicteric; extraocular muscles intact; Fundi normal without hemorrhages or exudates Nose without nasal septal hypertrophy Mouth/Parynx benign; Mallinpatti scale 2/3 Neck: No JVD, no carotid bruits; normal carotid upstroke Lungs: clear to ausculatation and percussion; no wheezing or rales Chest wall: without tenderness to palpitation Heart: PMI not displaced, RRR, s1 s2 normal, no systolic murmur, no diastolic murmur, no rubs, gallops, thrills, or heaves Abdomen: soft, nontender; no hepatosplenomehaly, BS+; abdominal aorta nontender and not dilated by palpation. Back: no CVA tenderness Pulses 2+ Musculoskeletal: full range of motion, normal strength, no joint deformities Extremities: no clubbing cyanosis or edema, Homan's sign negative  Neurologic: grossly nonfocal; Cranial nerves grossly wnl Psychologic: Normal mood and affect  ECG (independently read by me):  Sinus bradycardia 56 bpm. QTc interval 470 ms.  PR interval 18 ms.  No ST segment changes.  08/25/2014 ECG (independently read by me): Normal sinus rhythm at 66 bpm with mild RV conduction delay.  QTc interval 410 ms.  Nondiagnostic T changes in lead 3.  LABS: BMP Latest Ref Rng 08/20/2014  Glucose  70 - 99 mg/dL 100(H)  BUN 6 - 23 mg/dL 20  Creatinine 0.50 - 1.35 mg/dL 1.33  Sodium 135 - 145 mmol/L 135  Potassium 3.5 - 5.1 mmol/L 4.0  Chloride 96 - 112 mmol/L 105  CO2 19 - 32 mmol/L 28  Calcium 8.4 - 10.5 mg/dL 9.0   Hepatic Function Latest Ref Rng 08/20/2014  Total Protein 6.0 - 8.3 g/dL 7.0  Albumin 3.5 - 5.2 g/dL 4.4  AST 0 - 37 U/L 24  ALT 0 - 53 U/L 13  Alk Phosphatase 39 - 117 U/L 38(L)  Total Bilirubin 0.3 - 1.2 mg/dL 0.9   CBC Latest Ref Rng 08/20/2014  WBC 4.0 - 10.5 K/uL 5.8  Hemoglobin 13.0 - 17.0 g/dL 16.4  Hematocrit 39.0 - 52.0 % 48.4  Platelets 150 - 400 K/uL 264   Lab Results  Component Value Date   TSH 1.201 08/20/2014   Lipid Panel  No results found for: CHOL, TRIG, HDL,  CHOLHDL, VLDL, LDLCALC, LDLDIRECT    RADIOLOGY: No results found.   ASSESSMENT AND PLAN: Mr. Bensinger is a healthy-appearing 49 year old active gentleman without any prior known cardiac history  Who developed an episode of atrial fibrillation in January.  At that time there were several tears contributing to its development, including increased work-related stress, increased caffeine use with stimulation.  Drinks with significant caffeine prior to extensive physical  exercise right before going to bed. He has not had any recurrent atrial fibrillation.  His 2-D echo Doppler study did not reveal any significant structural heart disease. He has now been off caffeine for 4 months. He is sleeping well.  His sleep study did not demonstrate obstructive sleep apnea but suggested very mild increased upper airway resistance. I have suggested that it may be worthwhile trying to consider slowly weaning him off his Cardizem CD 120 mg regimen. Beginning next week he will reduce this to every other day and then change to every third day with ultimate discontinuance.  I am giving him a prescription for metoprolol tartrate to take 25 to 50 mg on an as needed basis if he notes development of recurrent tachyarrhythmias.  We discussed the importance of avoiding all pseudoephedrine preparations and to continue to stay off caffeine. His sleep is now significantly improved off his excessive caffeine use which previouslycontributed to his reduced sleep.  I extensively reviewed his sleep study with he and his wife.  He will continue aspirin 81 mg.  I will see him in 6 months for reevaluation or sooner if problems arise.  Time spent: 25 minutes  Troy Sine, MD, [facility] 12/01/2014 6:20 PM

## 2014-12-01 NOTE — Patient Instructions (Addendum)
Your physician has recommended you make the following change in your medication: wean and discontinue diltiazem as directed by Dr. Tresa EndoKelly. Start new lopressor prescription as directed on the bottle.   Your physician wants you to follow-up in: 6 months or sooner if needed. You will receive a reminder letter in the mail two months in advance. If you don't receive a letter, please call our office to schedule the follow-up appointment.

## 2014-12-17 ENCOUNTER — Other Ambulatory Visit (HOSPITAL_COMMUNITY): Payer: Self-pay | Admitting: *Deleted

## 2014-12-17 ENCOUNTER — Ambulatory Visit (HOSPITAL_COMMUNITY)
Admission: RE | Admit: 2014-12-17 | Discharge: 2014-12-17 | Disposition: A | Discharge status: Home / Self Care | Payer: BLUE CROSS/BLUE SHIELD | Source: Ambulatory Visit | Attending: Nurse Practitioner | Admitting: Nurse Practitioner

## 2014-12-17 ENCOUNTER — Encounter (HOSPITAL_COMMUNITY): Payer: Self-pay | Admitting: Nurse Practitioner

## 2014-12-17 ENCOUNTER — Telehealth: Payer: Self-pay | Admitting: Cardiovascular Disease

## 2014-12-17 VITALS — BP 130/84 | HR 71 | Ht 69.0 in | Wt 191.6 lb

## 2014-12-17 DIAGNOSIS — Z7982 Long term (current) use of aspirin: Secondary | ICD-10-CM | POA: Insufficient documentation

## 2014-12-17 DIAGNOSIS — I480 Paroxysmal atrial fibrillation: Secondary | ICD-10-CM | POA: Insufficient documentation

## 2014-12-17 MED ORDER — FLECAINIDE ACETATE 100 MG PO TABS: 300.0000 mg | ORAL_TABLET | Freq: Once | ORAL | Status: DC

## 2014-12-17 MED ORDER — DILTIAZEM HCL ER COATED BEADS 180 MG PO CP24: 180.0000 mg | ORAL_CAPSULE | Freq: Every day | ORAL | Status: DC

## 2014-12-17 MED ORDER — DILTIAZEM HCL ER COATED BEADS 240 MG PO CP24: 240.0000 mg | ORAL_CAPSULE | Freq: Once | ORAL | Status: DC

## 2014-12-17 MED ORDER — FLECAINIDE ACETATE 100 MG PO TABS
300.0000 mg | ORAL_TABLET | Freq: Once | ORAL | Status: AC
  Administered 2014-12-17: 300 mg via ORAL
  Filled 2014-12-17: qty 3

## 2014-12-17 MED ORDER — ASPIRIN 81 MG PO CHEW: 81.0000 mg | CHEWABLE_TABLET | Freq: Every day | ORAL | Status: DC

## 2014-12-17 NOTE — Patient Instructions (Addendum)
Your physician has recommended you make the following change in your medication:  1)Change cardizem to 240mg  once a day 2)Continue to use metoprolol as needed for fast heart rate  Follow up with Dr. Tresa EndoKelly in 2 weeks. 6/9 @11am   Any problems or questions -- afib clinic at [phone]

## 2014-12-17 NOTE — Telephone Encounter (Signed)
Spoke to patient. Notes HR was up while playing softball last night, went to dugout to rest, HR was still high - he checked pulse radially and could tell it was irreg.  He called on-call physician last night, was instructed to take extra metoprolol. Symptoms seemed to improve.  This AM he was again having irreg pulse - symptoms of nervousness/excitement. He spoke to Dr. Tresa EndoKelly, who advised him to take extra metoprolol and cardizem. He states still having symptoms. I informed patient I would contact [facility] Fib clinic to see if work-in. Spoke to MaroaStacy, she stated availability for 10:30am today. Called pt back, spoke w/ wife - informed her of where to report, gave her number for clinic in case further instruction needed. She verbalized understanding.

## 2014-12-17 NOTE — Telephone Encounter (Signed)
Pt's wife called in stating that the pt has been in Afib since last night around 9pm and she would like to know what to do . Please advise  Thanks

## 2014-12-17 NOTE — Progress Notes (Addendum)
Patient ID: Redge GainerKevin M Rauf, male   DOB: Nov 25, 1965, 49 y.o.   MRN: [id number]     Primary Care Physician: No primary care provider on file.  Cardiologist: Dr. Melvern SampleKelly   Terris Elliot GaultM Rodriques is a 49 y.o. male with a h/o PAF, first dx in February of this year and was converted with flecainide 300 mg x 1 in the ER. He has done well until last night when he went into afib with playing softball. He is training for a marathon and ran 3.5 miles yesterday prior to playing ball. He took an extra Cardizem last night, but continued in afib last night. Took  his usual Cardizem and and prn metoprolol this am, but remained in afib this am and was referred to the afib clinic. He does not feel fatigue or shortness of breath, just the irregularity. He is not using any stimulants,tobacco, negative sleep study in recent past. He does drink 12-14 beers over a weekend and we did discuss relationship of alcohol use to afib incidence. Echo in recent past has shown normal structure. No known CAD. Chadsvasc score is 0. On daily asa.  Today, he denies symptoms of palpitations, chest pain, shortness of breath, orthopnea, PND, lower extremity edema, dizziness, presyncope, syncope, or neurologic sequela. The patient is tolerating medications without difficulties and is otherwise without complaint today.   Past Medical History  Diagnosis Date  . Arrhythmia 08/20/14    afib   No past surgical history on file.  Current Outpatient Prescriptions  Medication Sig Dispense Refill  . aspirin 81 MG chewable tablet Chew 1 tablet (81 mg total) by mouth daily. 30 tablet 0  . diltiazem (CARDIZEM CD) 120 MG 24 hr capsule Take 1 capsule (120 mg total) by mouth once. 90 capsule 3  . metoprolol (LOPRESSOR) 50 MG tablet Take 1/2-1 tablet as needed for palptations 30 tablet 3   No current facility-administered medications for this encounter.    No Known Allergies  History   Social History  . Marital Status: Married    Spouse Name: N/A  .  Number of Children: N/A  . Years of Education: N/A   Occupational History  . Not on file.   Social History Main Topics  . Smoking status: Never Smoker   . Smokeless tobacco: Never Used  . Alcohol Use: Not on file  . Drug Use: Not on file  . Sexual Activity: Not on file   Other Topics Concern  . Not on file   Social History Narrative    Family History  Problem Relation Age of Onset  . Thyroid disease Mother   . Heart failure Father   . Atrial fibrillation Father   . Lung disease Maternal Grandmother   . Cancer Maternal Grandfather   . Cancer Paternal Grandmother   . Atrial fibrillation Paternal Grandfather   . Cancer Paternal Grandfather     ROS- All systems are reviewed and negative except as per the HPI above  Physical Exam: Filed Vitals:   12/17/14 1052  BP: 130/84  Pulse: 71  Height: 5\' 9"  (1.753 m)  Weight: 191 lb 9.6 oz (86.909 kg)    GEN- The patient is well appearing, alert and oriented x 3 today.   Head- normocephalic, atraumatic Eyes-  Sclera clear, conjunctiva pink Ears- hearing intact Oropharynx- clear Neck- supple, no JVP Lymph- no cervical lymphadenopathy Lungs- Clear to ausculation bilaterally, normal work of breathing Heart- Regular rate and rhythm, no murmurs, rubs or gallops, PMI not laterally displaced GI- soft,  NT, ND, + BS Extremities- no clubbing, cyanosis, or edema MS- no significant deformity or atrophy Skin- no rash or lesion Psych- euthymic mood, full affect Neuro- strength and sensation are intact  EKG- Atrial fibrillation at 71 bpm, QRS int 90 ms, QRS 391 ms, QTc 391 ms.  Assessment and Plan:  1.PAF  I spoke to Dr. Tresa Endo and he suggested to give flecainide 300 mg one time dose here and observe on the monitor.  Repeat EKG confirms afib rate 60-70's, flecainide given at 11:50 am and on cardiac monitor under close observation. Pt converted at 1 pm to SB at 52 bpm, IRBBB PR int 172 ms, QRS17ms, QTc 399 ms. BP 118/80. Nolon Rod  notified and will see pt before leaving afib clinic, will continue to monitor for another 20 mins. Per Dr. Landry Dyke instructions, increase Cardizem to 240 mg qd and he will continue to use metoprolol as needed. F/u with Dr. Tresa Endo in the office in two weeks.  Pt  received 1:1 observation and  cardiac monitoring in the afib clinic for 3.5 hours.

## 2015-01-01 ENCOUNTER — Encounter: Payer: Self-pay | Admitting: Cardiovascular Disease

## 2015-01-01 ENCOUNTER — Ambulatory Visit (INDEPENDENT_AMBULATORY_CARE_PROVIDER_SITE_OTHER): Payer: BLUE CROSS/BLUE SHIELD | Admitting: Cardiovascular Disease

## 2015-01-01 VITALS — BP 132/84 | HR 57 | Ht 68.0 in | Wt 194.0 lb

## 2015-01-01 DIAGNOSIS — I480 Paroxysmal atrial fibrillation: Secondary | ICD-10-CM | POA: Diagnosis not present

## 2015-01-01 MED ORDER — METOPROLOL SUCCINATE ER 25 MG PO TB24: 25.0000 mg | ORAL_TABLET | Freq: Every day | ORAL | Status: DC

## 2015-01-01 NOTE — Patient Instructions (Addendum)
Your physician has recommended you make the following change in your medication: start new toprol 25 XL mg prescription at night.  Your physician recommends that you schedule a follow-up appointment in: 2-3 months

## 2015-01-03 ENCOUNTER — Encounter: Payer: Self-pay | Admitting: Cardiovascular Disease

## 2015-01-03 NOTE — Progress Notes (Signed)
Patient ID: NICK STULTS, male   DOB: January 30, 1966, 49 y.o.   MRN: [id number]    HPI: LORRAINE TERRIQUEZ is a 49 y.o. male who presents to the office today for follow-up evaluation of his  episode of atrial fibrillation.  He was recently seen by Roderic Palau in the atrial fibrillation clinic for recurrent AF and presents for follow-up evaluation.    Eragon Hammond Orosz  denies any prior cardiac history.  He remains very active, exercises regularly, often doing insanity workouts at home after work.   In late January he hadincreased work-related stress.  On January 27 PM he had an episode of fast heartbeat at 3 AM in the morning.  His heart rhythm was irregular.  He went to the bathroom.  He ultimately went back to sleep.  However, upon awakening, he still noted that his pulse was irregularly irregular and beating fast.  He typically was drinking 2-3 cups of caffeine per day..  On that particular night after coming home from work and prior to doing an intensive workout.  He had a circumflex energy drink, which contains significant amount of caffeine and also had taken man cold.  He exercised aggressively late in the evening.  He presented to the emergency room at [facility] and his pulse on physical exam was noted to be 104.  His blood pressure was normal.  He was afebrile.  His heart rate on the monitor range from 90-140 bpm..  Laboratory was all normal including urinalysis, drug screen, TSH at 1.2, comprehensive metabolic panel, troponin, and CBC.  Free T4 was also normal at 1.15.  He was treated initially with IV diltiazem and received 1 oral dose of flecanide 300 mg  which  pharmacologically cardioverted him back to sinus rhythm.  I had recently met his wife who works at [employer].  She had contacted me regarding her husbands presentation. An echo Doppler study was done on 08/22/2014 and he saw me on February 1 for initial evaluation.  His echo Doppler study revealed entirely normal systolic and  diastolic function without wall motion abnormalities.  Ejection fraction was 55-60%.  The right atrium was interpreted to be mildly dilated.  He had normal valvular architecture. He has been on Cardizem CD  120 mg since that time in addition to baby aspirin.  He denies any awareness of recurrent atrial fibrillation.  He has not had any caffeine use and has not taken any of the  stimulant drinks prior to any of his cardiac workouts.  Due to concerns for possible sleep apnea with rare occurrences of awakening gasping for breath and snoring.  He was referred for diagnostic polysomnogram. This was done at [facility] on 11/05/2014.  He did not have obstructive sleep apnea but  There was suggestion of probable very mild increased upper airway resistance.  There was mild-to-moderate snoring.  He had reduction in REM sleep duration.  Since he has been off caffeine and have not been using stimulus drinks, he is now sleeping better. Sleep is restorative.  He is unaware of any recurrent episodes of palpitations. He was questioning if he would need to stay on this medication indefinitely or if in fact he could be taken off his Cardizem.  When I saw him one month ago, he was doing well and has not experienced any recurrent episodes of atrial fibrillation.  We discussed the possibility of in the future.  Coming off Cardizem but elected at that time to continue  his current dose of 120 mg.  2 weeks ago, while playing softball during a period of previous intense exercise prior to the softball game.  He did a triple and became very charged up with intense running towards third base.  Socially, he noticed his pulse being irregular.  He develop recurrent atrial fibrillation.  He had taken a dose of pressure that evening.  I had spoken with his wife that morning and advise an additional dose of Lopressor but due to continued irregularity.  He was sent to the A. fib clinic bradycardia was seen by Roderic Palau.  I did see  him during that encounter, but prior to seeing me.  We have discussed giving him and I 300 mg 1 time.  He ultimately converted back to sinus rhythm after one hour of therapy.  At that time, we recommended he increase his Cardizem to 40 mg but ultimately he discontinued this secondary to development of ankle swelling.  He presents for evaluation.  He has been limiting the intensity of his exercise.    Past Medical History  Diagnosis Date  . Arrhythmia 08/20/14    afib    History reviewed. No pertinent past surgical history.  No Known Allergies  Current Outpatient Prescriptions  Medication Sig Dispense Refill  . aspirin 81 MG chewable tablet Chew 1 tablet (81 mg total) by mouth daily. 30 tablet 0  . diltiazem (CARDIZEM CD) 120 MG 24 hr capsule Take 1 capsule by mouth daily.    . metoprolol (LOPRESSOR) 50 MG tablet Take 1/2-1 tablet as needed for palptations 30 tablet 3  . metoprolol succinate (TOPROL XL) 25 MG 24 hr tablet Take 1 tablet (25 mg total) by mouth daily. 90 tablet 3   No current facility-administered medications for this visit.    Socially he is married to [employer].  He is married for 18 years.  He attended college at [employer].  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  He has 2 children, ages 25 and 24.  He exercises regularly and does participate in races.  He does drink occasional beer.  Family History  Problem Relation Age of Onset  . Thyroid disease Mother   . Heart failure Father   . Atrial fibrillation Father   . Lung disease Maternal Grandmother   . Cancer Maternal Grandfather   . Cancer Paternal Grandmother   . Atrial fibrillation Paternal Grandfather   . Cancer Paternal Grandfather    Additional family history is notable that both parents are still living, mother is 57 and has thyroid issues, his father is 28 and has a history of atrial fibrillation and congestive heart failure.  He has 2 brothers, ages 74 and 34 and are alive and well without medical  problems.  ROS General: Negative; No fevers, chills, or night sweats HEENT: Negative; No changes in vision or hearing, sinus congestion, difficulty swallowing Pulmonary: Negative; No cough, wheezing, shortness of breath, hemoptysis Cardiovascular:  See HPI; No chest pain, presyncope, syncope, palpitations, edema GI: Negative; No nausea, vomiting, diarrhea, or abdominal pain GU: Negative; No dysuria, hematuria, or difficulty voiding Musculoskeletal: Negative; no myalgias, joint pain, or weakness Hematologic/Oncologic: Negative; no easy bruising, bleeding Endocrine: Negative; no heat/cold intolerance; no diabetes Neuro: Negative; no changes in balance, headaches Skin: Negative; No rashes or skin lesions Psychiatric: Negative; No behavioral problems, depression Sleep: Positive for snoring and at several times he has awakened gasping for breath; No daytime sleepiness, hypersomnolence, bruxism, restless legs, hypnogagnic hallucinations Other comprehensive  14 point system review is negative   Physical Exam BP 132/84 mmHg  Pulse 57  Ht 5' 8" (1.727 m)  Wt 87.998 kg (194 lb)  BMI 29.50 kg/m2 General: Alert, oriented, no distress.  Healthy-appearing in good physical shape but obviously nervous.  Skin: normal turgor, no rashes, warm and dry HEENT: Normocephalic, atraumatic. Pupils equal round and reactive to light; sclera anicteric; extraocular muscles intact; Fundi normal without hemorrhages or exudates Nose without nasal septal hypertrophy Mouth/Parynx benign; Mallinpatti scale 2/3 Neck: No JVD, no carotid bruits; normal carotid upstroke Lungs: clear to ausculatation and percussion; no wheezing or rales Chest wall: without tenderness to palpitation Heart: PMI not displaced, RRR, s1 s2 normal, no systolic murmur, no diastolic murmur, no rubs, gallops, thrills, or heaves Abdomen: soft, nontender; no hepatosplenomehaly, BS+; abdominal aorta nontender and not dilated by palpation. Back: no CVA  tenderness Pulses 2+ Musculoskeletal: full range of motion, normal strength, no joint deformities Extremities: no clubbing cyanosis or edema, Homan's sign negative  Neurologic: grossly nonfocal; Cranial nerves grossly wnl Psychologic: Normal mood and affect  ECG (independently read by me): Sinus bradycardia 57 bpm.  Atrial premature complex.  PR interval 152 ms.  QTc interval 402 ms.  ECG (independently read by me):  Sinus bradycardia 56 bpm. QTc interval 470 ms.  PR interval 18 ms.  No ST segment changes.  08/25/2014 ECG (independently read by me): Normal sinus rhythm at 66 bpm with mild RV conduction delay.  QTc interval 410 ms.  Nondiagnostic T changes in lead 3.  LABS: BMP Latest Ref Rng 08/20/2014  Glucose 70 - 99 mg/dL 100(H)  BUN 6 - 23 mg/dL 20  Creatinine 0.50 - 1.35 mg/dL 1.33  Sodium 135 - 145 mmol/L 135  Potassium 3.5 - 5.1 mmol/L 4.0  Chloride 96 - 112 mmol/L 105  CO2 19 - 32 mmol/L 28  Calcium 8.4 - 10.5 mg/dL 9.0   Hepatic Function Latest Ref Rng 08/20/2014  Total Protein 6.0 - 8.3 g/dL 7.0  Albumin 3.5 - 5.2 g/dL 4.4  AST 0 - 37 U/L 24  ALT 0 - 53 U/L 13  Alk Phosphatase 39 - 117 U/L 38(L)  Total Bilirubin 0.3 - 1.2 mg/dL 0.9   CBC Latest Ref Rng 08/20/2014  WBC 4.0 - 10.5 K/uL 5.8  Hemoglobin 13.0 - 17.0 g/dL 16.4  Hematocrit 39.0 - 52.0 % 48.4  Platelets 150 - 400 K/uL 264   Lab Results  Component Value Date   TSH 1.201 08/20/2014   Lipid Panel  No results found for: CHOL, TRIG, HDL, CHOLHDL, VLDL, LDLCALC, LDLDIRECT    RADIOLOGY: No results found.   ASSESSMENT AND PLAN: Mr. Harrower is a healthy-appearing 49 year old active gentleman without any prior known cardiac history who developed an episode of atrial fibrillation in January 2016.  At that time there were several factors contributing to its development, including increased work-related stress, increased caffeine use with stimulation.  Drinks with significant caffeine prior to extensive physical   exercise right before going to bed.  A 2-D echo Doppler study did not reveal any significant structural heart disease. He has  been off caffeine for 4 months. He is sleeping well.  His sleep study did not demonstrate obstructive sleep apnea but suggested very mild increased upper airway resistance.  He developed a recurrent episode of atrial fibrillation again after exercising hard and later that night intense after he did a triple and ran hard to third base.  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  He again converted with Ronal Fear and eye 1 time 3 mg dose and has been maintaining sinus rhythm since.  He does have a history of EtOH use and I also have some suggested reduction in his beer intake.  Presently, I am electing to add Toprol-XL 25 mg and recommended he initiate taking this at bedtime.  He will continue the current dose of Cardizem CD 120 mg. It may be possible to treat him alone with beta blocker at an increased dose if necessary.  However, if he develops recurrent atrial fibrillation, I would institute anti-rhythmic treatment.  His chads2vascscore is 0, and at present he is on aspirin alone without anticoagulation therapy.  He will be going to [place] for vacation several weeks.  If he does note breakthrough arrhythmia he also has the prescription for metoprolol tartrate to take in addition to his metoprolol succinate, which I prescribed today.  I will see him in 2 months for reevaluation or sooner if problem arise.  Time spent: 25 minutes  Troy Sine, MD, [facility] 01/03/2015 1:43 PM

## 2015-01-25 ENCOUNTER — Telehealth: Payer: Self-pay | Admitting: Physician Assistant

## 2015-01-25 MED ORDER — FLECAINIDE ACETATE 150 MG PO TABS
300.0000 mg | ORAL_TABLET | Freq: Once | ORAL | Status: DC
Start: 2015-01-25 — End: 2021-11-22

## 2015-01-25 NOTE — Telephone Encounter (Signed)
The patient's wife, Misty StanleyLisa, called to state he was back in Afib as of 0600hrs..  Rate 88-101.  BP 134/87.  He was converted with Flecainide 300mg  previously.  We will try that again. [place] in [place] on [place].  Script sent to [facility].  No caffeine and limited ETOH last night.  Excessive ETOH was discussed previously.    Joliet Mallozzi, PAC

## 2015-01-25 NOTE — Telephone Encounter (Signed)
Agree with 300 mg dose of flecanide acutely; increase toprol to 50 mg daily

## 2015-09-27 ENCOUNTER — Emergency Department (HOSPITAL_BASED_OUTPATIENT_CLINIC_OR_DEPARTMENT_OTHER)
Admission: EM | Admit: 2015-09-27 | Discharge: 2015-09-27 | Disposition: A | Discharge status: Home / Self Care | Payer: BLUE CROSS/BLUE SHIELD | Attending: Emergency Medicine | Admitting: Emergency Medicine

## 2015-09-27 ENCOUNTER — Encounter (HOSPITAL_BASED_OUTPATIENT_CLINIC_OR_DEPARTMENT_OTHER): Payer: Self-pay | Admitting: *Deleted

## 2015-09-27 DIAGNOSIS — Z79899 Other long term (current) drug therapy: Secondary | ICD-10-CM | POA: Insufficient documentation

## 2015-09-27 DIAGNOSIS — Z7982 Long term (current) use of aspirin: Secondary | ICD-10-CM | POA: Diagnosis not present

## 2015-09-27 DIAGNOSIS — I4891 Unspecified atrial fibrillation: Secondary | ICD-10-CM | POA: Diagnosis not present

## 2015-09-27 DIAGNOSIS — Y9389 Activity, other specified: Secondary | ICD-10-CM | POA: Diagnosis not present

## 2015-09-27 DIAGNOSIS — Y998 Other external cause status: Secondary | ICD-10-CM | POA: Diagnosis not present

## 2015-09-27 DIAGNOSIS — W208XXA Other cause of strike by thrown, projected or falling object, initial encounter: Secondary | ICD-10-CM | POA: Insufficient documentation

## 2015-09-27 DIAGNOSIS — Z23 Encounter for immunization: Secondary | ICD-10-CM | POA: Insufficient documentation

## 2015-09-27 DIAGNOSIS — S3991XA Unspecified injury of abdomen, initial encounter: Secondary | ICD-10-CM | POA: Diagnosis present

## 2015-09-27 DIAGNOSIS — Y9289 Other specified places as the place of occurrence of the external cause: Secondary | ICD-10-CM | POA: Insufficient documentation

## 2015-09-27 DIAGNOSIS — S30811A Abrasion of abdominal wall, initial encounter: Secondary | ICD-10-CM | POA: Insufficient documentation

## 2015-09-27 MED ORDER — TETANUS-DIPHTH-ACELL PERTUSSIS 5-2.5-18.5 LF-MCG/0.5 IM SUSP
0.5000 mL | Freq: Once | INTRAMUSCULAR | Status: AC
  Administered 2015-09-27: 0.5 mL via INTRAMUSCULAR
  Filled 2015-09-27: qty 0.5

## 2015-09-27 NOTE — Discharge Instructions (Signed)
Abrasion An abrasion is a cut or scrape on the outer surface of your skin. An abrasion does not extend through all of the layers of your skin. It is important to care for your abrasion properly to prevent infection. CAUSES Most abrasions are caused by falling on or gliding across the ground or another surface. When your skin rubs on something, the outer and inner layer of skin rubs off.  SYMPTOMS A cut or scrape is the main symptom of this condition. The scrape may be bleeding, or it may appear red or pink. If there was an associated fall, there may be an underlying bruise. DIAGNOSIS An abrasion is diagnosed with a physical exam. TREATMENT Treatment for this condition depends on how large and deep the abrasion is. Usually, your abrasion will be cleaned with water and mild soap. This removes any dirt or debris that may be stuck. An antibiotic ointment may be applied to the abrasion to help prevent infection. A bandage (dressing) may be placed on the abrasion to keep it clean. You may also need a tetanus shot. HOME CARE INSTRUCTIONS Medicines  Take or apply medicines only as directed by your health care provider.  If you were prescribed an antibiotic ointment, finish all of it even if you start to feel better. Wound Care  Clean the wound with mild soap and water 2-3 times per day or as directed by your health care provider. Pat your wound dry with a clean towel. Do not rub it.  There are many different ways to close and cover a wound. Follow instructions from your health care provider about:  Wound care.  Dressing changes and removal.  Check your wound every day for signs of infection. Watch for:  Redness, swelling, or pain.  Fluid, blood, or pus. General Instructions  Keep the dressing dry as directed by your health care provider. Do not take baths, swim, use a hot tub, or do anything that would put your wound underwater until your health care provider approves.  If there is  swelling, raise (elevate) the injured area above the level of your heart while you are sitting or lying down.  Keep all follow-up visits as directed by your health care provider. This is important. SEEK MEDICAL CARE IF:  You received a tetanus shot and you have swelling, severe pain, redness, or bleeding at the injection site.  Your pain is not controlled with medicine.  You have increased redness, swelling, or pain at the site of your wound. SEEK IMMEDIATE MEDICAL CARE IF:  You have a red streak going away from your wound.  You have a fever.  You have fluid, blood, or pus coming from your wound.  You notice a bad smell coming from your wound or your dressing.   This information is not intended to replace advice given to you by your health care provider. Make sure you discuss any questions you have with your health care provider.   Document Released: 04/20/2005 Document Revised: 04/01/2015 Document Reviewed: 07/09/2014 Elsevier Interactive Patient Education 2016 Elsevier Inc.  

## 2015-09-27 NOTE — ED Notes (Signed)
MD at bedside. 

## 2015-09-27 NOTE — ED Notes (Addendum)
Pt states he was cutting branches off a tree when a large branch fell onto his abd, pt w/ an abrasion to LUQ - pt denies shortness of breath or chest pain.

## 2015-09-27 NOTE — ED Notes (Signed)
States was on a ladder cutting tree limbs with a chain saw, large tree limb hit patient in LUQ of abd, large abrassion at injury site

## 2015-09-27 NOTE — ED Notes (Signed)
Pt states he take one 81mg  ASA daily, no longer on blood thinners per cardiologist (hx A-Fib)

## 2015-09-27 NOTE — ED Notes (Signed)
Abdomen appears normal in size, non tender to palpation, denies any N/V.

## 2015-09-27 NOTE — ED Notes (Signed)
DC instructions provided to pt and wife, opportunity for questions provided

## 2015-09-27 NOTE — ED Provider Notes (Signed)
CSN: [id number]     Arrival date & time 09/27/15  1804 History  By signing my name below, I, Tanda Rockers, attest that this documentation has been prepared under the direction and in the presence of Rolan Bucco, MD. Electronically Signed: Tanda Rockers, ED Scribe. 09/27/2015. 6:54 PM.   Chief Complaint  Patient presents with  . Abdominal Injury   The history is provided by the patient. No language interpreter was used.     HPI Comments: Ricky Arnold is a 50 y.o. male who presents to the Emergency Department complaining of sudden onset LUQ abdominal pain s/p abdominal injury that occurred earlier today. Pt states he was on a ladder cutting down tree limps when a large tree limp hit him in the LUQ. He had immediate pain to the area that has since resolved. Pt is currently pain free. He does have a large abrasion at the site of injury. Denies vomiting, diarrhea, hematuria, difficulty urinating, or any other associated symptoms. Tetanus status unknown.   Past Medical History  Diagnosis Date  . Arrhythmia 08/20/14    afib   Past Surgical History  Procedure Laterality Date  . Hernia repair     Family History  Problem Relation Age of Onset  . Thyroid disease Mother   . Heart failure Father   . Atrial fibrillation Father   . Lung disease Maternal Grandmother   . Cancer Maternal Grandfather   . Cancer Paternal Grandmother   . Atrial fibrillation Paternal Grandfather   . Cancer Paternal Grandfather    Social History  Substance Use Topics  . Smoking status: Never Smoker   . Smokeless tobacco: Never Used  . Alcohol Use: 0.0 oz/week    0 Standard drinks or equivalent per week    Review of Systems  Constitutional: Negative for fever.  HENT: Negative for nosebleeds.   Respiratory: Negative for shortness of breath.   Cardiovascular: Negative for chest pain.  Gastrointestinal: Positive for abdominal pain (resolved). Negative for nausea, vomiting and diarrhea.  Genitourinary:  Negative for hematuria and difficulty urinating.  Musculoskeletal: Negative for back pain, arthralgias and neck pain.  Neurological: Negative for weakness, numbness and headaches.  All other systems reviewed and are negative.  Allergies  Review of patient's allergies indicates no known allergies.  Home Medications   Prior to Admission medications   Medication Sig Start Date End Date Taking? Authorizing Provider  aspirin 81 MG chewable tablet Chew 1 tablet (81 mg total) by mouth daily. 12/17/14  Yes Newman Nip, NP  diltiazem (CARDIZEM CD) 120 MG 24 hr capsule Take 1 capsule by mouth daily. 12/16/14   Historical Provider, MD  flecainide (TAMBOCOR) 150 MG tablet Take 2 tablets (300 mg total) by mouth once. 01/25/15   Dwana Melena, PA-C  metoprolol (LOPRESSOR) 50 MG tablet Take 1/2-1 tablet as needed for palptations 12/01/14   Lennette Bihari, MD  metoprolol succinate (TOPROL XL) 25 MG 24 hr tablet Take 1 tablet (25 mg total) by mouth daily. 01/01/15   Lennette Bihari, MD   BP 161/93 mmHg  Pulse 80  Temp(Src) 97.9 F (36.6 C) (Oral)  Resp 18  Ht  (1.753 m)  Wt 206 lb (93.441 kg)  BMI 30.41 kg/m2  SpO2 99%   Physical Exam  Constitutional: He is oriented to person, place, and time. He appears well-developed and well-nourished.  HENT:  Head: Normocephalic and atraumatic.  Eyes: Pupils are equal, round, and reactive to light.  Neck: Normal range of motion.  Neck supple.  Cardiovascular: Normal rate, regular rhythm and normal heart sounds.   Pulmonary/Chest: Effort normal and breath sounds normal. No respiratory distress. He has no wheezes. He has no rales. He exhibits no tenderness.  Abdominal: Soft. Bowel sounds are normal. There is no tenderness. There is no rebound and no guarding.  Abrasion across the left upper abdomen. There is no underlying tenderness to the abdomen including around the spleen.  Musculoskeletal: Normal range of motion. He exhibits no edema.  No rib tenderness.   Lymphadenopathy:    He has no cervical adenopathy.  Neurological: He is alert and oriented to person, place, and time.  Skin: Skin is warm and dry. No rash noted.  Psychiatric: He has a normal mood and affect.    ED Course  Procedures (including critical care time)  DIAGNOSTIC STUDIES: Oxygen Saturation is 99% on RA, normal by my interpretation.    COORDINATION OF CARE: 6:50 PM-Discussed treatment plan which includes Tdap with pt at bedside and pt agreed to plan.   Labs Review Labs Reviewed - No data to display  Imaging Review No results found.   EKG Interpretation None      MDM   Final diagnoses:  Abdominal wall abrasion, initial encounter   Patient presents with an abdominal wall abrasion. There is no underlying tenderness. No suggestions of a splenic injury or other intra-abdominal injury. He was discharged home in good condition. His tetanus shot was updated. He was given return precautions to return if he has any abdominal pain vomiting or other worsening symptoms.  I personally performed the services described in this documentation, which was scribed in my presence.  The recorded information has been reviewed and considered.      Rolan BuccoMelanie Destyn Parfitt, MD 09/27/15 [phone]

## 2016-07-13 ENCOUNTER — Telehealth: Payer: Self-pay | Admitting: Cardiology

## 2016-07-13 NOTE — Telephone Encounter (Signed)
No follow up call

## 2016-07-13 NOTE — Telephone Encounter (Signed)
Pt called and has been in a fib since 10 pm last night.  He tried his flecainide 50 mg that [place] in [place] [facility], he has not had a fib in some time.  He took his flecainide 50 that was prescribed but still in a fib.  Hr is 68-80 which is similar to previous episode in a fib clinic.   He wants to take 300 mg of flecainide but we have not seen pt since 12/2014 and I discussed with Dr. Delton SeeNelson and she would prefer pt to go to ER and be cardioverted.  I have given pt and wife the instructions.  Will send this to Rudi CocoDonna Carroll in a fib clinic for a follow up appt.  As well.

## 2016-07-14 ENCOUNTER — Ambulatory Visit (HOSPITAL_COMMUNITY)
Admission: RE | Admit: 2016-07-14 | Discharge: 2016-07-14 | Disposition: A | Discharge status: Home / Self Care | Payer: BLUE CROSS/BLUE SHIELD | Source: Ambulatory Visit | Attending: Nurse Practitioner | Admitting: Nurse Practitioner

## 2016-07-14 DIAGNOSIS — I4510 Unspecified right bundle-branch block: Secondary | ICD-10-CM | POA: Diagnosis present

## 2016-07-14 NOTE — Progress Notes (Addendum)
Patient walked in for EKG to confirm rhythm. Ricky Cocoonna Wreatha Sturgeon NP to review.  Pt of Dr. Tresa EndoKelly. Has onset of afib last pm and has rx from [place] in [place] flecainide 50 mg as needed. He has successfully taken 300 mg of flecainide at once x 2 different occassions in this area and did well.Marland Kitchen. He took 50 mg yesterday pm and then took 100 mg later last evening. He converted this am around 4am. He wont EKG to confirm SR. 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 He was told next occurrence try 100 mg of flecainide and  repeat in 12 hours. Can call here as needed.

## 2019-04-22 DIAGNOSIS — R202 Paresthesia of skin: Secondary | ICD-10-CM | POA: Diagnosis not present

## 2019-04-22 DIAGNOSIS — M25552 Pain in left hip: Secondary | ICD-10-CM | POA: Diagnosis not present

## 2019-08-08 ENCOUNTER — Ambulatory Visit: Payer: BLUE CROSS/BLUE SHIELD | Attending: Internal Medicine

## 2019-08-08 DIAGNOSIS — Z20822 Contact with and (suspected) exposure to covid-19: Secondary | ICD-10-CM

## 2019-08-10 LAB — NOVEL CORONAVIRUS, NAA: SARS-CoV-2, NAA: DETECTED — AB

## 2019-09-11 DIAGNOSIS — B029 Zoster without complications: Secondary | ICD-10-CM | POA: Diagnosis not present

## 2020-06-09 DIAGNOSIS — D225 Melanocytic nevi of trunk: Secondary | ICD-10-CM | POA: Diagnosis not present

## 2020-06-09 DIAGNOSIS — L989 Disorder of the skin and subcutaneous tissue, unspecified: Secondary | ICD-10-CM | POA: Diagnosis not present

## 2020-06-09 DIAGNOSIS — D485 Neoplasm of uncertain behavior of skin: Secondary | ICD-10-CM | POA: Diagnosis not present

## 2020-06-09 DIAGNOSIS — B353 Tinea pedis: Secondary | ICD-10-CM | POA: Diagnosis not present

## 2020-06-09 DIAGNOSIS — L821 Other seborrheic keratosis: Secondary | ICD-10-CM | POA: Diagnosis not present

## 2020-06-09 DIAGNOSIS — L820 Inflamed seborrheic keratosis: Secondary | ICD-10-CM | POA: Diagnosis not present

## 2020-06-09 DIAGNOSIS — D1801 Hemangioma of skin and subcutaneous tissue: Secondary | ICD-10-CM | POA: Diagnosis not present

## 2020-07-15 DIAGNOSIS — L905 Scar conditions and fibrosis of skin: Secondary | ICD-10-CM | POA: Diagnosis not present

## 2020-07-15 DIAGNOSIS — L989 Disorder of the skin and subcutaneous tissue, unspecified: Secondary | ICD-10-CM | POA: Diagnosis not present

## 2020-07-15 DIAGNOSIS — D485 Neoplasm of uncertain behavior of skin: Secondary | ICD-10-CM | POA: Diagnosis not present

## 2020-08-19 DIAGNOSIS — S81802D Unspecified open wound, left lower leg, subsequent encounter: Secondary | ICD-10-CM | POA: Diagnosis not present

## 2021-08-24 DIAGNOSIS — Z0000 Encounter for general adult medical examination without abnormal findings: Secondary | ICD-10-CM | POA: Diagnosis not present

## 2021-08-24 DIAGNOSIS — K429 Umbilical hernia without obstruction or gangrene: Secondary | ICD-10-CM | POA: Diagnosis not present

## 2021-08-24 DIAGNOSIS — Z125 Encounter for screening for malignant neoplasm of prostate: Secondary | ICD-10-CM | POA: Diagnosis not present

## 2021-08-24 DIAGNOSIS — Z13220 Encounter for screening for lipoid disorders: Secondary | ICD-10-CM | POA: Diagnosis not present

## 2021-08-25 ENCOUNTER — Other Ambulatory Visit: Payer: Self-pay | Admitting: Family Medicine

## 2021-08-25 DIAGNOSIS — K429 Umbilical hernia without obstruction or gangrene: Secondary | ICD-10-CM

## 2021-09-08 DIAGNOSIS — D225 Melanocytic nevi of trunk: Secondary | ICD-10-CM | POA: Diagnosis not present

## 2021-09-08 DIAGNOSIS — L648 Other androgenic alopecia: Secondary | ICD-10-CM | POA: Diagnosis not present

## 2021-09-08 DIAGNOSIS — L820 Inflamed seborrheic keratosis: Secondary | ICD-10-CM | POA: Diagnosis not present

## 2021-09-08 DIAGNOSIS — R208 Other disturbances of skin sensation: Secondary | ICD-10-CM | POA: Diagnosis not present

## 2021-09-08 DIAGNOSIS — L821 Other seborrheic keratosis: Secondary | ICD-10-CM | POA: Diagnosis not present

## 2021-09-08 DIAGNOSIS — L538 Other specified erythematous conditions: Secondary | ICD-10-CM | POA: Diagnosis not present

## 2021-09-08 DIAGNOSIS — L298 Other pruritus: Secondary | ICD-10-CM | POA: Diagnosis not present

## 2021-09-08 DIAGNOSIS — L814 Other melanin hyperpigmentation: Secondary | ICD-10-CM | POA: Diagnosis not present

## 2021-09-08 DIAGNOSIS — L570 Actinic keratosis: Secondary | ICD-10-CM | POA: Diagnosis not present

## 2021-09-16 ENCOUNTER — Ambulatory Visit
Admission: RE | Admit: 2021-09-16 | Discharge: 2021-09-16 | Disposition: A | Discharge status: Home / Self Care | Payer: BC Managed Care – PPO | Source: Ambulatory Visit | Attending: Family Medicine | Admitting: Family Medicine

## 2021-09-16 ENCOUNTER — Other Ambulatory Visit: Payer: Self-pay

## 2021-09-16 DIAGNOSIS — K429 Umbilical hernia without obstruction or gangrene: Secondary | ICD-10-CM | POA: Diagnosis not present

## 2021-11-22 ENCOUNTER — Ambulatory Visit (HOSPITAL_COMMUNITY)
Admission: RE | Admit: 2021-11-22 | Discharge: 2021-11-22 | Disposition: A | Discharge status: Home / Self Care | Payer: BLUE CROSS/BLUE SHIELD | Source: Ambulatory Visit | Attending: Physician Assistant | Admitting: Physician Assistant

## 2021-11-22 ENCOUNTER — Encounter (HOSPITAL_COMMUNITY): Payer: Self-pay | Admitting: Physician Assistant

## 2021-11-22 VITALS — BP 168/100 | HR 142 | Ht 69.0 in | Wt 227.0 lb

## 2021-11-22 DIAGNOSIS — Z7901 Long term (current) use of anticoagulants: Secondary | ICD-10-CM | POA: Diagnosis not present

## 2021-11-22 DIAGNOSIS — Z79899 Other long term (current) drug therapy: Secondary | ICD-10-CM | POA: Diagnosis not present

## 2021-11-22 DIAGNOSIS — E669 Obesity, unspecified: Secondary | ICD-10-CM | POA: Diagnosis not present

## 2021-11-22 DIAGNOSIS — I480 Paroxysmal atrial fibrillation: Secondary | ICD-10-CM

## 2021-11-22 DIAGNOSIS — Z6833 Body mass index (BMI) 33.0-33.9, adult: Secondary | ICD-10-CM | POA: Insufficient documentation

## 2021-11-22 MED ORDER — FLECAINIDE ACETATE 150 MG PO TABS: 300.0000 mg | ORAL_TABLET | Freq: Once | ORAL | 0 refills | Status: DC

## 2021-11-22 MED ORDER — DILTIAZEM HCL ER COATED BEADS 120 MG PO CP24: 120.0000 mg | ORAL_CAPSULE | Freq: Every day | ORAL | 1 refills | Status: DC

## 2021-11-22 MED ORDER — RIVAROXABAN 20 MG PO TABS: 20.0000 mg | ORAL_TABLET | Freq: Every day | ORAL | 2 refills | Status: DC

## 2021-11-22 NOTE — Progress Notes (Signed)
? ? ?Primary Care Physician: Pcp, No ?Primary Cardiologist: Dr Tresa Endo (remotely) ?Primary Electrophysiologist: none ?Referring Physician: Dr Tresa Endo  ? ? ?Ricky Arnold is a 56 y.o. male with a history of atrial fibrillation who presents for consultation in the [facility].  The patient was initially diagnosed with atrial fibrillation 08/2014 after presenting to the ED and was chemically converted with flecainide. Seen by Rudi Coco remotely. Patient has a CHADS2VASC score of 0. He has done well for several years using PIP flecainide. He typically has an episode between 1-3 years which responds quickly to flecainide. His last episode prior to today was 10/21/21. This morning around 2 AM he had symptoms of tachypalpitations and took flecainide but remained in afib. He does admit that he has had more alcohol recently, especially on the weekends. He remains in rapid afib today.  ? ?Today, he denies symptoms of chest pain, shortness of breath, orthopnea, PND, lower extremity edema, dizziness, presyncope, syncope, snoring, daytime somnolence, bleeding, or neurologic sequela. The patient is tolerating medications without difficulties and is otherwise without complaint today.  ? ? ?Atrial Fibrillation Risk Factors: ? ?he does not have symptoms or diagnosis of sleep apnea. ?Sleep study 2016 ?he does not have a history of rheumatic fever. ?he does have a history of alcohol use. ?The patient does have a history of early familial atrial fibrillation or other arrhythmias. ? ?he has a BMI of Body mass index is 33.52 kg/m?Marland KitchenMarland Kitchen ?Filed Weights  ? 11/22/21 1127  ?Weight: 103 kg  ? ? ?Family History  ?Problem Relation Age of Onset  ? Thyroid disease Mother   ? Heart failure Father   ? Atrial fibrillation Father   ? Lung disease Maternal Grandmother   ? Cancer Maternal Grandfather   ? Cancer Paternal Grandmother   ? Atrial fibrillation Paternal Grandfather   ? Cancer Paternal Grandfather   ? ? ? ?Atrial  Fibrillation Management history: ? ?Previous antiarrhythmic drugs: flecainide  ?Previous cardioversions: none ?Previous ablations: none ?CHADS2VASC score: 0 ?Anticoagulation history: none ? ? ?Past Medical History:  ?Diagnosis Date  ? Arrhythmia 08/20/14  ? afib  ? ?Past Surgical History:  ?Procedure Laterality Date  ? HERNIA REPAIR    ? ? ?Current Outpatient Medications  ?Medication Sig Dispense Refill  ? aspirin 81 MG chewable tablet Chew 1 tablet (81 mg total) by mouth daily. 30 tablet 0  ? Biotin 78938 MCG TABS Take by mouth.    ? cholecalciferol (VITAMIN D3) 25 MCG (1000 UNIT) tablet Take 1,000 Units by mouth daily.    ? flecainide (TAMBOCOR) 150 MG tablet Take 2 tablets (300 mg total) by mouth once. 2 tablet 0  ? Multiple Vitamin (MULTIVITAMIN WITH MINERALS) TABS tablet Take 1 tablet by mouth daily.    ? Omega-3 Fatty Acids (FISH OIL) 1000 MG CAPS Take by mouth.    ? zinc gluconate 50 MG tablet Take 50 mg by mouth daily.    ? ?No current facility-administered medications for this encounter.  ? ? ?No Known Allergies ? ?Social History  ? ?Socioeconomic History  ? Marital status: Married  ?  Spouse name: Not on file  ? Number of children: Not on file  ? Years of education: Not on file  ? Highest education level: Not on file  ?Occupational History  ? Not on file  ?Tobacco Use  ? Smoking status: Never  ? Smokeless tobacco: Never  ? Tobacco comments:  ?  Never smoke 11/22/21  ?Substance and Sexual Activity  ?  Alcohol use: Yes  ?  Alcohol/week: 24.0 standard drinks  ?  Types: 24 Cans of beer per week  ?  Comment: 24 beers on weekends 11/22/21  ? Drug use: No  ? Sexual activity: Not on file  ?Other Topics Concern  ? Not on file  ?Social History Narrative  ? Not on file  ? ?Social Determinants of Health  ? ?Financial Resource Strain: Not on file  ?Food Insecurity: Not on file  ?Transportation Needs: Not on file  ?Physical Activity: Not on file  ?Stress: Not on file  ?Social Connections: Not on file  ?Intimate Partner  Violence: Not on file  ? ? ? ?ROS- All systems are reviewed and negative except as per the HPI above. ? ?Physical Exam: ?Vitals:  ? 11/22/21 1127  ?BP: (!) 168/100  ?Pulse: (!) 142  ?Weight: 103 kg  ?Height: 5\' 9"  (1.753 m)  ? ? ?GEN- The patient is a well appearing obese male, alert and oriented x 3 today.   ?Head- normocephalic, atraumatic ?Eyes-  Sclera clear, conjunctiva pink ?Ears- hearing intact ?Oropharynx- clear ?Neck- supple  ?Lungs- Clear to ausculation bilaterally, normal work of breathing ?Heart- irregular rate and rhythm, no murmurs, rubs or gallops  ?GI- soft, NT, ND, + BS ?Extremities- no clubbing, cyanosis, or edema ?MS- no significant deformity or atrophy ?Skin- no rash or lesion ?Psych- euthymic mood, full affect ?Neuro- strength and sensation are intact ? ?Wt Readings from Last 3 Encounters:  ?11/22/21 103 kg  ?09/27/15 93.4 kg  ?01/01/15 88 kg  ? ? ?EKG today demonstrates  ?Afib with RVR ?Vent. rate 142 BPM ?PR interval * ms ?QRS duration 106 ms ?QT/QTcB 310/476 ms ? ?Echo 08/22/14 demonstrated  ?- Left ventricle: The cavity size was normal. Wall thickness was   normal. Systolic function was normal. The estimated ejection   fraction was in the range of 55% to 60%. Left ventricular   diastolic function parameters were normal.  ?- Right atrium: The atrium was mildly dilated. ? ?Epic records are reviewed at length today ? ?CHA2DS2-VASc Score = 0  ?The patient's score is based upon: ?CHF History: 0 ?HTN History: 0 ?Diabetes History: 0 ?Stroke History: 0 ?Vascular Disease History: 0 ?Age Score: 0 ?Gender Score: 0 ?    ? ? ?ASSESSMENT AND PLAN: ?1. Paroxysmal Atrial Fibrillation (ICD10:  I48.0) ?The patient's CHA2DS2-VASc score is 0, indicating a 0.2% annual risk of stroke.   ?Patient in rapid afib today, suspect related to alcohol use.  ?Will start diltiazem 120 mg daily for rate control. ?Will start Xarelto 20 mg daily in case DCCV is needed.  ?Will give new Rx for flecainide 300 mg PRN once every 4  days (his current Rx is expired). ?We discussed long term management for his arrhythmias, specifically afib ablation. He is interested in discussing with EP, will refer.  ?Kardia for home monitoring.  ? ?2. Obesity ?Body mass index is 33.52 kg/m?. ?Lifestyle modification was discussed at length including regular exercise and weight reduction. ? ? ?Follow up in the AF clinic later this week.  ? ? ?Ricky Kymberlie Brazeau PA-C ?Afib Clinic ?[facility] ?[address] ?[place], [place] ?[phone] ?11/22/2021 ?11:42 AM ? ?

## 2021-11-22 NOTE — Patient Instructions (Signed)
Stop aspirin ? ?Start Xarelto 20mg  once a day with supper ? ?Start Cardizem 120mg  once a day ? ? ?

## 2021-11-26 ENCOUNTER — Ambulatory Visit (HOSPITAL_COMMUNITY): Payer: BLUE CROSS/BLUE SHIELD | Admitting: Physician Assistant

## 2021-12-16 DIAGNOSIS — I480 Paroxysmal atrial fibrillation: Secondary | ICD-10-CM | POA: Diagnosis not present

## 2021-12-16 DIAGNOSIS — Z1211 Encounter for screening for malignant neoplasm of colon: Secondary | ICD-10-CM | POA: Diagnosis not present

## 2022-01-04 ENCOUNTER — Institutional Professional Consult (permissible substitution): Payer: BLUE CROSS/BLUE SHIELD | Admitting: Cardiology

## 2022-01-04 DIAGNOSIS — I480 Paroxysmal atrial fibrillation: Secondary | ICD-10-CM | POA: Diagnosis not present

## 2022-01-09 DIAGNOSIS — E6609 Other obesity due to excess calories: Secondary | ICD-10-CM | POA: Insufficient documentation

## 2022-02-15 DIAGNOSIS — D123 Benign neoplasm of transverse colon: Secondary | ICD-10-CM | POA: Diagnosis not present

## 2022-02-15 DIAGNOSIS — Z1211 Encounter for screening for malignant neoplasm of colon: Secondary | ICD-10-CM | POA: Diagnosis not present

## 2022-09-16 DIAGNOSIS — Z872 Personal history of diseases of the skin and subcutaneous tissue: Secondary | ICD-10-CM | POA: Diagnosis not present

## 2022-09-16 DIAGNOSIS — L821 Other seborrheic keratosis: Secondary | ICD-10-CM | POA: Diagnosis not present

## 2022-09-16 DIAGNOSIS — D225 Melanocytic nevi of trunk: Secondary | ICD-10-CM | POA: Diagnosis not present

## 2022-09-16 DIAGNOSIS — L814 Other melanin hyperpigmentation: Secondary | ICD-10-CM | POA: Diagnosis not present

## 2022-11-29 ENCOUNTER — Encounter: Payer: Self-pay | Admitting: Podiatry

## 2022-11-29 ENCOUNTER — Ambulatory Visit (INDEPENDENT_AMBULATORY_CARE_PROVIDER_SITE_OTHER): Payer: BC Managed Care – PPO

## 2022-11-29 ENCOUNTER — Ambulatory Visit: Payer: BC Managed Care – PPO | Admitting: Podiatry

## 2022-11-29 DIAGNOSIS — G5763 Lesion of plantar nerve, bilateral lower limbs: Secondary | ICD-10-CM | POA: Diagnosis not present

## 2022-11-29 DIAGNOSIS — M7750 Other enthesopathy of unspecified foot: Secondary | ICD-10-CM

## 2022-11-29 DIAGNOSIS — M7752 Other enthesopathy of left foot: Secondary | ICD-10-CM | POA: Diagnosis not present

## 2022-12-05 NOTE — Progress Notes (Signed)
  Subjective:  Patient ID: Ricky Arnold, male    DOB: 26-Oct-1965,  MRN: [id number]  Chief Complaint  Patient presents with   Foot Pain    NP Examine both feet for Plantar Plate tears. 2nd & 3rd toes starting to spread apart in "V" shape.    57 y.o. male presents with the above complaint. History confirmed with patient.  Sometimes feels like pressure or sharp pain into the toes like he is walking on a rock, no traumatic injury that he remembers  Objective:  Physical Exam: warm, good capillary refill, no trophic changes or ulcerative lesions, normal DP and PT pulses, normal sensory exam, and positive Lendell Caprice sign second and third toes bilateral, negative Mulder's click, mild tenderness to palpation in interspace, negative Lachman second and third MPJ bilateral, no pain on plantar plate   Radiographs: Multiple views x-ray of both feet: no fracture, dislocation, swelling or degenerative changes noted and mild deviation of second and third MTPJ's Assessment:   1. Morton's neuroma of both feet      Plan:  Patient was evaluated and treated and all questions answered.  Suspect he has a likely Morton's neuroma.  We discussed etiology and treatment options of these.  We reviewed his radiographs together.  Discussed treatment including supportive shoe gear offloading and injection therapy if becomes more symptomatic.  Currently not particularly bothersome for him he mainly wanted to figure out what this was.  Long-term we discussed surgical excision if it continues to worsen.  Return to see me as needed if it becomes worse.  No follow-ups on file.

## 2023-03-27 IMAGING — US US ABDOMEN LIMITED
1 series · 12 of 12 positions shown · non-contrast
Comparison: None.

CLINICAL DATA: Umbilical hernia.

EXAM:
ULTRASOUND ABDOMEN LIMITED

[Series 1: us abdomen limited · 12 acquisitions, 12 frames shown]
[im 1/12]
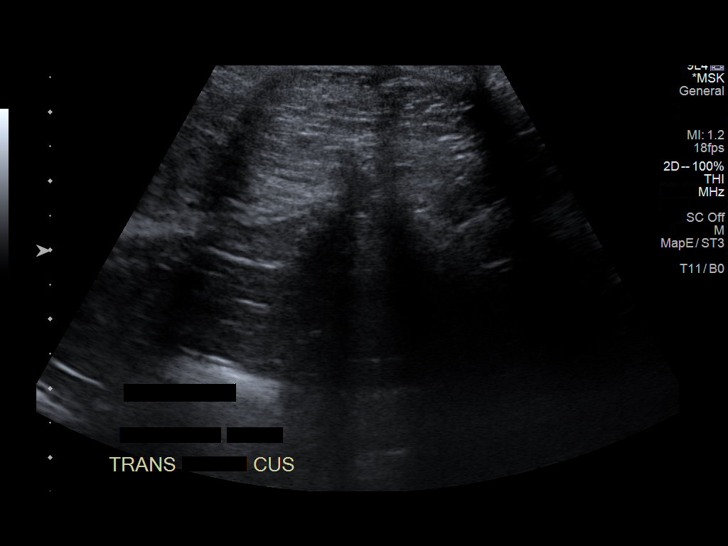
[im 2/12]
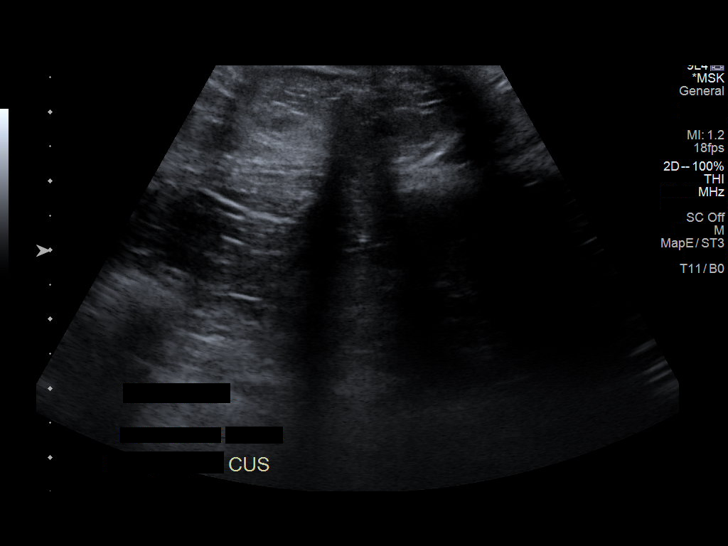
[im 3/12]
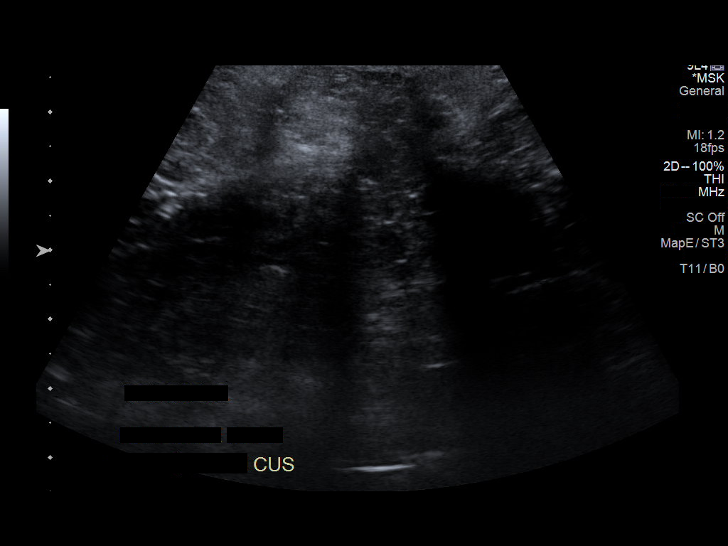
[im 4/12]
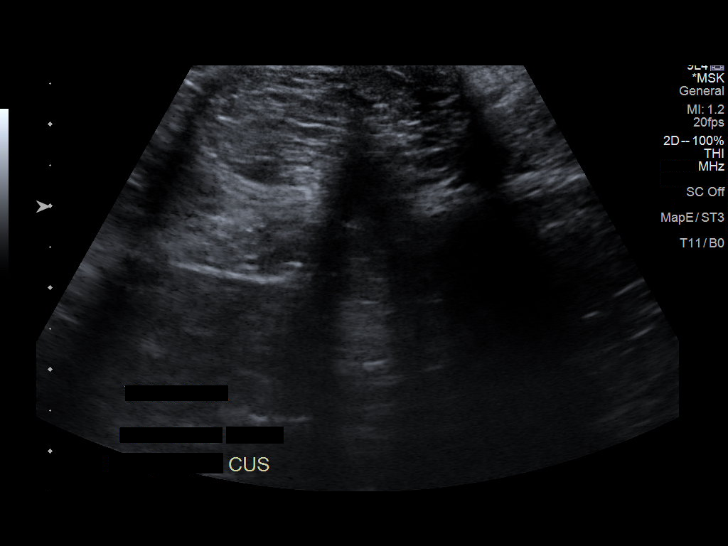
[im 5/12]
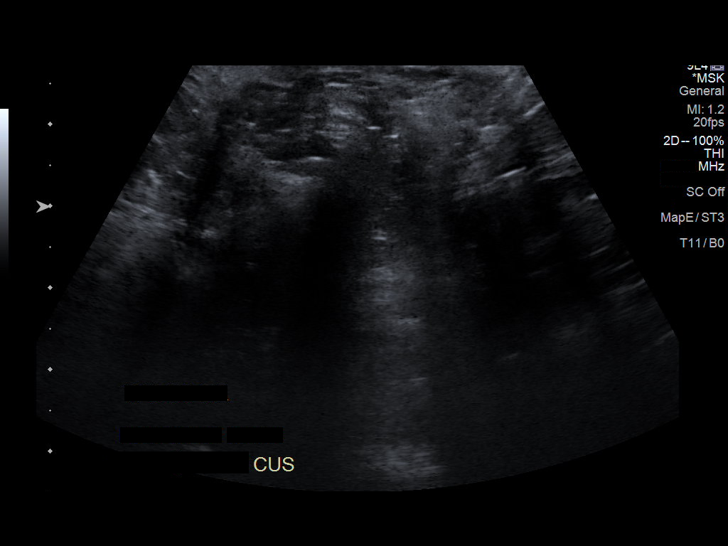
[im 6/12]
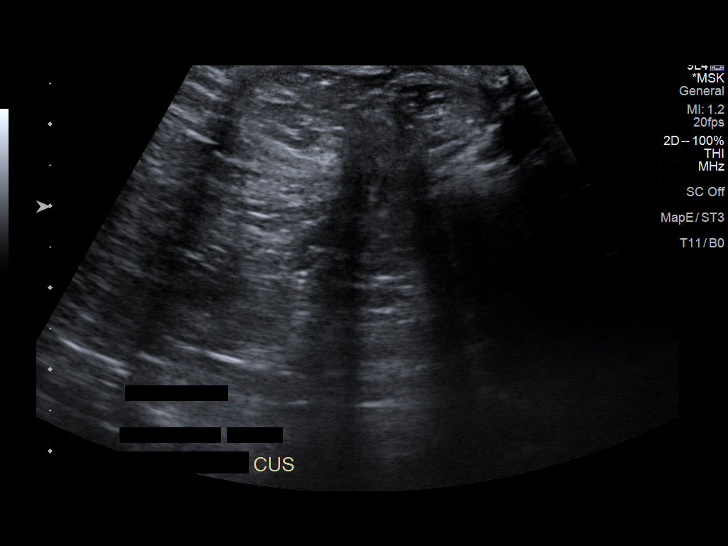
[im 7/12]
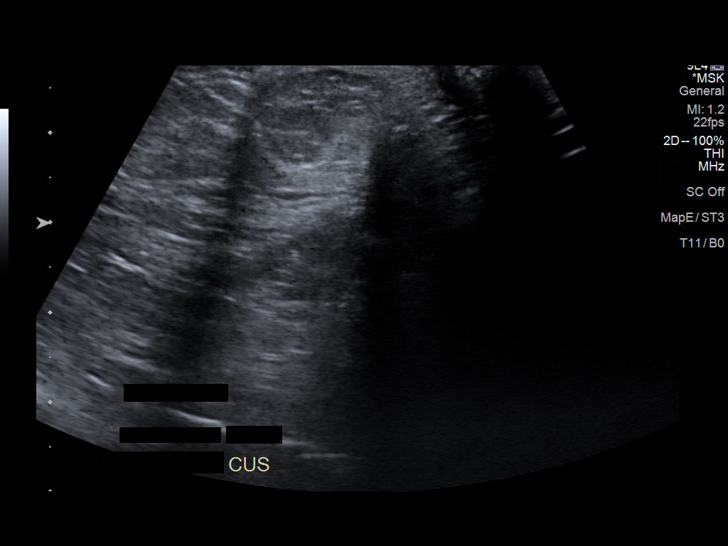
[im 8/12]
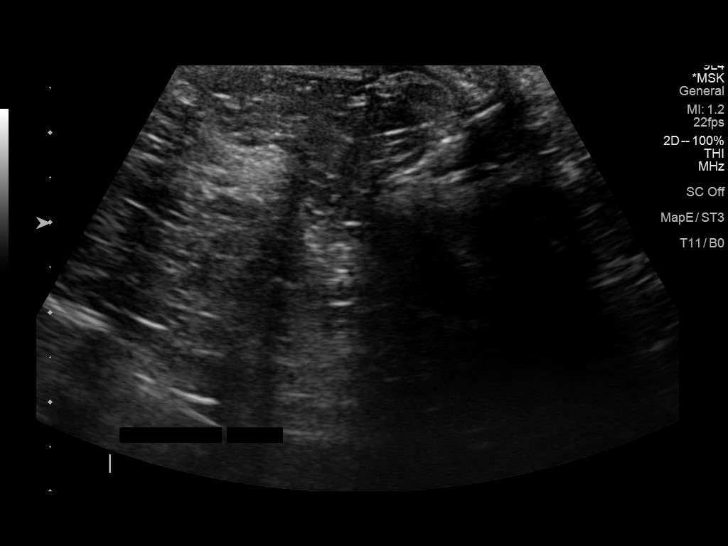
[im 9/12]
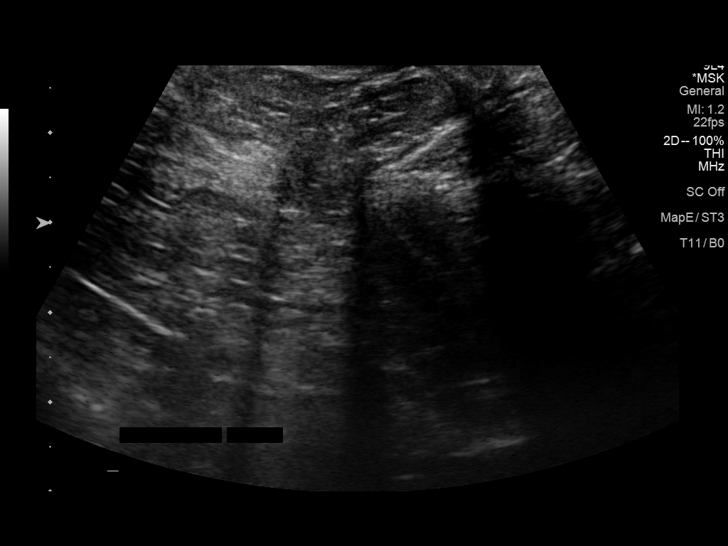
[im 10/12]
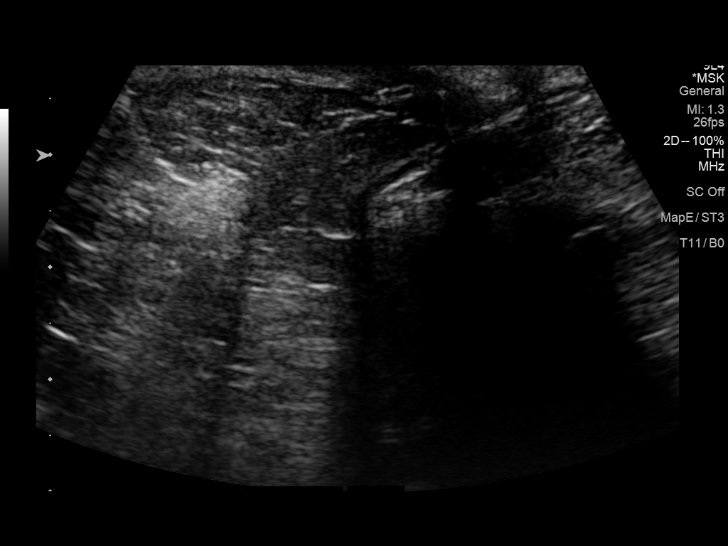
[im 11/12]
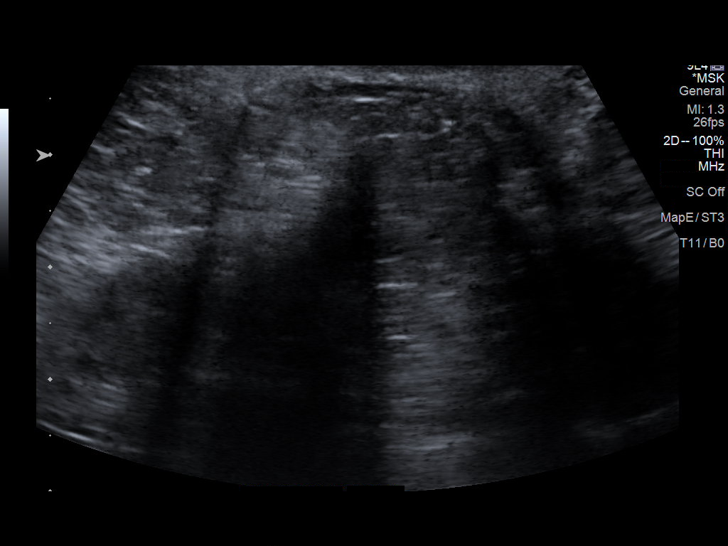
[im 12/12]
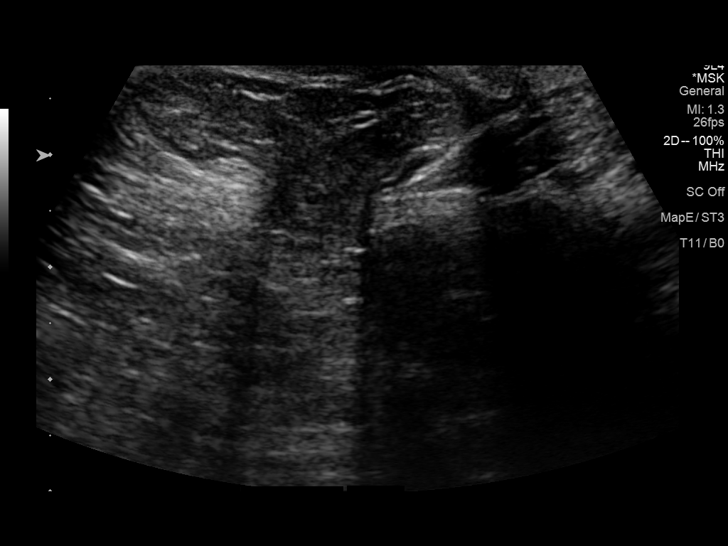

[12 of 12 positions shown; findings below may reference images not displayed]

FINDINGS: There is a fat containing umbilical hernia measuring approximately 1
x 3 cm. The neck of the hernia defect measures approximately 1 cm.
No bowel herniation noted. No fluid collection.
IMPRESSION: Fat containing umbilical hernia.

## 2024-01-02 DIAGNOSIS — L820 Inflamed seborrheic keratosis: Secondary | ICD-10-CM | POA: Diagnosis not present

## 2024-01-02 DIAGNOSIS — L570 Actinic keratosis: Secondary | ICD-10-CM | POA: Diagnosis not present

## 2024-01-02 DIAGNOSIS — L578 Other skin changes due to chronic exposure to nonionizing radiation: Secondary | ICD-10-CM | POA: Diagnosis not present

## 2024-05-30 ENCOUNTER — Ambulatory Visit (HOSPITAL_COMMUNITY)
Admission: RE | Admit: 2024-05-30 | Discharge: 2024-05-30 | Disposition: A | Discharge status: Home / Self Care | Source: Ambulatory Visit | Attending: Physician Assistant | Admitting: Physician Assistant

## 2024-05-30 VITALS — BP 136/84 | HR 74 | Ht 69.0 in | Wt 238.4 lb

## 2024-05-30 DIAGNOSIS — I4891 Unspecified atrial fibrillation: Secondary | ICD-10-CM | POA: Diagnosis not present

## 2024-05-30 DIAGNOSIS — I480 Paroxysmal atrial fibrillation: Secondary | ICD-10-CM

## 2024-05-30 NOTE — Progress Notes (Signed)
 Primary Care Physician: Pcp, No Primary Cardiologist: Dr Burnard (remotely) Primary Electrophysiologist: none Referring Physician: Dr Burnard Drivers Ricky Arnold is a 58 y.o. male with a history of atrial fibrillation who presents for consultation in the [facility].  The patient was initially diagnosed with atrial fibrillation 08/2014 after presenting to the ED and was chemically converted with flecainide . Seen by Arland Don remotely. Patient has a CHADS2VASC score of 0. He has done well for several years using PIP flecainide . He typically has an episode between 1-3 years which responds quickly to flecainide . He was seen at [facility] 01/04/22 for EP evaluation but decided not to pursue treatment at that time.   Patient returns for follow up for atrial fibrillation. He reports that his episodes have been occurring more frequently, at lease once per month. His most recent episode was yesterday. He admits he is stressed. He drinks 12-18 beers weekly.   Today, he  denies symptoms of chest pain, shortness of breath, orthopnea, PND, lower extremity edema, dizziness, presyncope, syncope, snoring, daytime somnolence, bleeding, or neurologic sequela. The patient is tolerating medications without difficulties and is otherwise without complaint today.    Atrial Fibrillation Risk Factors:  he does not have symptoms or diagnosis of sleep apnea. Sleep study 2016 he does not have a history of rheumatic fever. he does have a history of alcohol use. The patient does have a history of early familial atrial fibrillation or other arrhythmias.  Atrial Fibrillation Management history:  Previous antiarrhythmic drugs: flecainide   Previous cardioversions: none Previous ablations: none Anticoagulation history: none   Past Medical History:  Diagnosis Date   Arrhythmia 08/20/14   afib    Current Outpatient Medications  Medication Sig Dispense Refill   aspirin  EC 81 MG tablet Take by  mouth. (Patient taking differently: Take 81 mg by mouth daily.)     cholecalciferol (VITAMIN D3) 25 MCG (1000 UNIT) tablet Take 1,000 Units by mouth daily.     Coenzyme Q10 (CO Q 10 PO) Take 1 tablet by mouth every morning.     flecainide  (TAMBOCOR ) 150 MG tablet Take 2 tablets (300 mg total) by mouth once for 1 dose. May repeat every 4 days 10 tablet 0   Misc Natural Products (BEET ROOT PO) Uses powder- 3 times weekly     Multiple Vitamin (MULTIVITAMIN WITH MINERALS) TABS tablet Take 1 tablet by mouth daily.     zinc gluconate 50 MG tablet Take 50 mg by mouth daily.     diltiazem  (CARDIZEM  CD) 120 MG 24 hr capsule Take 1 capsule (120 mg total) by mouth daily. (Patient not taking: Reported on 05/30/2024) 30 capsule 1   rivaroxaban  (XARELTO ) 20 MG TABS tablet Take 1 tablet (20 mg total) by mouth daily with supper. (Patient not taking: Reported on 05/30/2024) 30 tablet 2   No current facility-administered medications for this encounter.    ROS- All systems are reviewed and negative except as per the HPI above.  Physical Exam: Vitals:   05/30/24 1132  Weight: 108.1 kg  Height: 5' 9 (1.753 m)    GEN: Well nourished, well developed in no acute distress CARDIAC: Regular rate and rhythm, no murmurs, rubs, gallops RESPIRATORY:  Clear to auscultation without rales, wheezing or rhonchi  ABDOMEN: Soft, non-tender, non-distended EXTREMITIES:  No edema; No deformity    Wt Readings from Last 3 Encounters:  05/30/24 108.1 kg  11/22/21 103 kg  09/27/15 93.4 kg    EKG today demonstrates  SR Vent. rate 74 BPM PR interval 162 ms QRS duration 94 ms QT/QTcB 350/388 ms   Echo 08/22/14 demonstrated  - Left ventricle: The cavity size was normal. Wall thickness was   normal. Systolic function was normal. The estimated ejection   fraction was in the range of 55% to 60%. Left ventricular   diastolic function parameters were normal.  - Right atrium: The atrium was mildly dilated.  Epic records are  reviewed at length today   CHA2DS2-VASc Score = 0  The patient's score is based upon: CHF History: 0 HTN History: 0 Diabetes History: 0 Stroke History: 0 Vascular Disease History: 0 Age Score: 0 Gender Score: 0       ASSESSMENT AND PLAN: Paroxysmal Atrial Fibrillation (ICD10:  I48.0) The patient's CHA2DS2-VASc score is 0, indicating a 0.2% annual risk of stroke.   Patient in SR today but having more frequent afib episodes.  We discussed rhythm control options today including AAD and ablation. He would like to have a consultation with Dr Kennyth for ablation. We discussed using scheduled flecainide  or Multaq as a bridge to ablation. He would like to take time to think about his options.  He will need to start anticoagulation prior to the ablation.   Obesity Body mass index is 35.21 kg/m.  Encouraged lifestyle modification   Follow up with Dr Kennyth to establish care and discuss afib ablation. If he starts AAD, will need ECG in one week.    Daril Kicks PA-C Afib Clinic [facility] [address] [place], [place] [address] [phone] 05/30/2024 11:40 AM

## 2024-05-31 ENCOUNTER — Other Ambulatory Visit (HOSPITAL_COMMUNITY): Payer: Self-pay | Admitting: *Deleted

## 2024-05-31 MED ORDER — DILTIAZEM HCL ER COATED BEADS 120 MG PO CP24: 120.0000 mg | ORAL_CAPSULE | Freq: Every day | ORAL | 1 refills | Status: AC

## 2024-05-31 MED ORDER — FLECAINIDE ACETATE 150 MG PO TABS: 300.0000 mg | ORAL_TABLET | Freq: Once | ORAL | 3 refills | Status: AC

## 2024-06-11 DIAGNOSIS — Z6836 Body mass index (BMI) 36.0-36.9, adult: Secondary | ICD-10-CM | POA: Diagnosis not present

## 2024-06-11 DIAGNOSIS — I480 Paroxysmal atrial fibrillation: Secondary | ICD-10-CM | POA: Diagnosis not present

## 2024-06-13 ENCOUNTER — Ambulatory Visit: Admitting: Cardiology

## 2024-06-17 ENCOUNTER — Telehealth (HOSPITAL_BASED_OUTPATIENT_CLINIC_OR_DEPARTMENT_OTHER): Payer: Self-pay

## 2024-06-17 ENCOUNTER — Ambulatory Visit: Payer: Self-pay | Admitting: General Surgery

## 2024-06-17 DIAGNOSIS — K429 Umbilical hernia without obstruction or gangrene: Secondary | ICD-10-CM | POA: Diagnosis not present

## 2024-06-17 NOTE — Telephone Encounter (Signed)
   Name: AYDRIEN FROMAN  DOB: April 16, 1966  MRN: [id number]  Primary Cardiologist: None  Chart reviewed as part of pre-operative protocol coverage. The patient has an upcoming visit scheduled with Dr. Kennyth on 06/19/2024 at which time clearance can be addressed in case there are any issues that would impact surgical recommendations.  Hernia repair is not scheduled until TBD as below. I added preop FYI to appointment note so that provider is aware to address at time of outpatient visit.  Per office protocol the cardiology provider should forward their finalized clearance decision and recommendations regarding antiplatelet therapy to the requesting party below.    I will route this message as FYI to requesting party and remove this message from the preop box as separate preop APP input not needed at this time.   Please call with any questions.  Lum LITTIE Louis, NP  06/17/2024, 3:21 PM

## 2024-06-17 NOTE — Telephone Encounter (Signed)
   Pre-operative Risk Assessment    Patient Name: Ricky Arnold  DOB: 1966-06-28 MRN: [id number]   Date of last office visit: 01/01/2015, Dr. Debby Sor Date of next office visit: 06/19/2024, Dr. Fonda Kitty    Request for Surgical Clearance    Procedure:  Hernia Repair   Date of Surgery:  Clearance TBD                                Surgeon: Dr. Lynda Leos, MD Surgeon's Group or Practice Name: [facility]  Phone number: [phone] Fax number: [phone]   Type of Clearance Requested:   - Medical  - Pharmacy:  Hold Aspirin      Type of Anesthesia:  General    Additional requests/questions:    SignedAsberry KANDICE Dunning   06/17/2024, 2:50 PM

## 2024-06-17 NOTE — H&P (Signed)
 Chief Complaint: New Consultation (Umbilical hernia)     History of Present Illness: Ricky Arnold is a 58 y.o. male who is seen today as an office consultation at the request of Dr. Marvene for evaluation of New Consultation (Umbilical hernia) .   History of Present Illness Ricky Arnold is a 58 year old male who presents with an enlarging umbilical hernia.  He has had an umbilical hernia for about three years that has progressively enlarged and deformed the umbilicus. He denies pain but notes a mild twinge with pressure. He has partially reduced it manually, but it recurs.  He had an open inguinal hernia repair with mesh over 30 years ago without mesh-related issues and has remained active since.  He has atrial fibrillation and is not on anticoagulation. Episodes have become more frequent over the past year, and he is planning evaluation with a new cardiologist for possible ablation. He remains active and wants to address this to return to sports.  He prefers to avoid stronger pain medications because they have caused nausea. He favors ibuprofen or Tylenol and recalls significant soreness after his prior hernia surgery, especially with laughing.      Review of Systems: A complete review of systems was obtained from the patient.  I have reviewed this information and discussed as appropriate with the patient.  See HPI as well for other ROS.  Review of Systems  Constitutional:  Negative for fever.  HENT:  Negative for congestion.   Eyes:  Negative for blurred vision.  Respiratory:  Negative for cough, shortness of breath and wheezing.   Cardiovascular:  Negative for chest pain and palpitations.  Gastrointestinal:  Negative for heartburn.  Genitourinary:  Negative for dysuria.  Musculoskeletal:  Negative for myalgias.  Skin:  Negative for rash.  Neurological:  Negative for dizziness and headaches.  Psychiatric/Behavioral:  Negative for depression and suicidal ideas.   All other  systems reviewed and are negative.     Medical History: History reviewed. No pertinent past medical history.  There is no problem list on file for this patient.   Past Surgical History: Procedure Laterality Date  HERNIA REPAIR      No Known Allergies  Current Outpatient Medications on File Prior to Visit Medication Sig Dispense Refill  flecainide  (TAMBOCOR ) 150 MG tablet Take 300 mg by mouth    No current facility-administered medications on file prior to visit.   Family History Problem Relation Age of Onset  Thyroid  disease Mother   Atrial fibrillation (Abnormal heart rhythm sometimes requiring treatment with blood thinners) Father   Heart failure Father     Social History  Tobacco Use Smoking Status Never Smokeless Tobacco Never    Social History  Socioeconomic History  Marital status: Married Tobacco Use  Smoking status: Never  Smokeless tobacco: Never Vaping Use  Vaping status: Never Used Substance and Sexual Activity  Drug use: Never  Social Drivers of Health  Housing Stability: Unknown (06/17/2024)  Housing Stability Vital Sign   Homeless in the Last Year: No   Objective:   Vitals:  06/17/24 1344 BP: 136/82 Pulse: 94 Temp: 36.7 C (98 F) SpO2: 97% Weight: (!) 110 kg (242 lb 9.6 oz) Height: 172.7 cm (5' 8) PainSc: 0-No pain   Body mass index is 36.89 kg/m.  Physical Exam Constitutional:      General: He is not in acute distress.    Appearance: Normal appearance.  HENT:     Head: Normocephalic.     Nose: No rhinorrhea.  Mouth/Throat:     Mouth: Mucous membranes are moist.     Pharynx: Oropharynx is clear.  Eyes:     General: No scleral icterus.    Pupils: Pupils are equal, round, and reactive to light.  Cardiovascular:     Rate and Rhythm: Normal rate.     Pulses: Normal pulses.  Pulmonary:     Effort: Pulmonary effort is normal. No respiratory distress.     Breath sounds: No stridor. No wheezing.  Abdominal:      General: Abdomen is flat. There is no distension.     Tenderness: There is no abdominal tenderness. There is no guarding or rebound.     Hernia: A hernia is present. Hernia is present in the umbilical area.  Musculoskeletal:        General: Normal range of motion.     Cervical back: Normal range of motion and neck supple.  Skin:    General: Skin is warm and dry.     Capillary Refill: Capillary refill takes less than 2 seconds.     Coloration: Skin is not jaundiced.  Neurological:     General: No focal deficit present.     Mental Status: He is alert and oriented to person, place, and time. Mental status is at baseline.  Psychiatric:        Mood and Affect: Mood normal.        Thought Content: Thought content normal.        Judgment: Judgment normal.      Hernia Size:3cm Incarcerated: no Initial Hernia   Assessment and Plan: Diagnoses and all orders for this visit:  Umbilical hernia without obstruction or gangrene    Ricky Arnold is a 58 y.o. male   1.  We will proceed to the OR for a lap ventral hernia repair with mesh. 2. All risks and benefits were discussed with the patient, to generally include infection, bleeding, damage to surrounding structures, acute and chronic nerve pain, and recurrence. Alternatives were offered and described.  All questions were answered and the patient voiced understanding of the procedure and wishes to proceed at this point.       No follow-ups on file.  Lynda Leos, MD, [facility], [place] [facility]

## 2024-06-18 NOTE — Progress Notes (Signed)
 Electrophysiology Office Note:   Date:  06/20/2024  ID:  XZAVIOR REINIG, DOB 11-30-1965, MRN [id number]  Primary Cardiologist: None Electrophysiologist: Fonda Kitty, MD      History of Present Illness:   Ricky Arnold is a 58 y.o. male with h/o paroxysmal atrial fibrillation who is being seen today for evaluation for catheter ablation.   Discussed the use of AI scribe software for clinical note transcription with the patient, who gave verbal consent to proceed.  History of Present Illness Ricky Arnold is a 58 year old male with atrial fibrillation who presents for evaluation of increased frequency of episodes and consideration of ablation.  He has had atrial fibrillation since age 56, initially experiencing one or two episodes per year, often around the holidays and associated with stress. Recently, there has been an increase in the frequency and duration of episodes. He manages his condition with flecainide , which he uses for emergencies. He is not currently on a blood thinner.  He is concerned about the impact of the procedure on his ability to resume physical activity, particularly running.  He is also dealing with a hernia and has consulted a careers adviser about it. He is considering the timing of the hernia repair in relation to the potential atrial fibrillation ablation procedure.  He has not had an ultrasound of his heart since 2016.  Review of systems complete and found to be negative unless listed in HPI.   EP Information / Studies Reviewed:    EKG is ordered today. Personal review as below.  EKG Interpretation Date/Time:  Wednesday June 19 2024 15:40:54 EST Ventricular Rate:  91 PR Interval:  160 QRS Duration:  96 QT Interval:  340 QTC Calculation: 418 R Axis:   -4  Text Interpretation: Normal sinus rhythm Incomplete right bundle branch block When compared with ECG of 30-May-2024 11:40, No significant change was found Confirmed by Kitty Fonda [phone]) on  06/19/2024 3:47:34 PM   ECG 12/17/23: AF   Echo 07/2014: Study Conclusions   - Left ventricle: The cavity size was normal. Wall thickness was    normal. Systolic function was normal. The estimated ejection    fraction was in the range of 55% to 60%. Left ventricular    diastolic function parameters were normal.  - Right atrium: The atrium was mildly dilated.   Risk Assessment/Calculations:    CHA2DS2-VASc Score = 0   This indicates a 0.2% annual risk of stroke. The patient's score is based upon: CHF History: 0 HTN History: 0 Diabetes History: 0 Stroke History: 0 Vascular Disease History: 0 Age Score: 0 Gender Score: 0              Physical Exam:   VS:  BP 126/74   Pulse 91   Ht 5' 8 (1.727 m)   Wt 238 lb 3.2 oz (108 kg)   SpO2 95%   BMI 36.22 kg/m    Wt Readings from Last 3 Encounters:  06/19/24 238 lb 3.2 oz (108 kg)  05/30/24 238 lb 6.4 oz (108.1 kg)  11/22/21 227 lb (103 kg)    General: Well developed, in no acute distress.  Neck: No JVD.  Cardiac: Normal rate, regular rhythm.  Resp: Normal work of breathing.  Ext: No edema.  Neuro: No gross focal deficits.  Psych: Normal affect.    ASSESSMENT AND PLAN:    #Paroxysmal atrial fibrillation: Symptomatic.  Increasing frequency and duration of episodes. #High risk medication use: Flecainide .  Normal PR and QRS intervals. -  Discussed treatment options today for AF including antiarrhythmic drug therapy and ablation. Discussed risks, recovery and likelihood of success with each treatment strategy. Risk, benefits, and alternatives to EP study and ablation for afib were discussed. These risks include but are not limited to stroke, bleeding, vascular damage, tamponade, perforation, damage to the esophagus, lungs, phrenic nerve and other structures, pulmonary vein stenosis, worsening renal function, coronary vasospasm and death.  Discussed potential need for repeat ablation procedures and antiarrhythmic drugs after an  initial ablation. The patient understands these risk and wishes to proceed.  We will therefore proceed with catheter ablation at the next available time.  Carto, ICE, anesthesia are requested for the procedure.   We will not obtain CT PV protocol prior to the procedure. -Will order an updated echocardiogram prior to ablation. - Continue diltiazem  120 mg once daily. - Continue flecainide  as pill in pocket.  #Hypercoagulable state due to AF: CHADSVASC score of 0. - Patient will need to start oral anticoagulation in order for catheter ablation to be performed.  He will start Xarelto  4 weeks prior to ablation and continue for 60 days after.    #Preop evaluation: Elective hernia repair. - We discussed that given plans for hernia surgery it will need to be delayed until at least 60 days after his catheter ablation can be performed so that oral anticoagulation can be interrupted.  Otherwise no EP objection to hernia surgery at this time but will need to reassess post ablation.  Also, we will order an updated echocardiogram.   Follow up with Dr. Kennyth 3 months after catheter ablation.   Signed, Fonda Kennyth, MD

## 2024-06-19 ENCOUNTER — Encounter: Payer: Self-pay | Admitting: Cardiology

## 2024-06-19 ENCOUNTER — Ambulatory Visit: Attending: Cardiology | Admitting: Cardiology

## 2024-06-19 VITALS — BP 126/74 | HR 91 | Ht 68.0 in | Wt 238.2 lb

## 2024-06-19 DIAGNOSIS — Z79899 Other long term (current) drug therapy: Secondary | ICD-10-CM | POA: Diagnosis not present

## 2024-06-19 DIAGNOSIS — Z01812 Encounter for preprocedural laboratory examination: Secondary | ICD-10-CM

## 2024-06-19 DIAGNOSIS — Z01810 Encounter for preprocedural cardiovascular examination: Secondary | ICD-10-CM

## 2024-06-19 DIAGNOSIS — I4891 Unspecified atrial fibrillation: Secondary | ICD-10-CM | POA: Diagnosis not present

## 2024-06-19 DIAGNOSIS — D6869 Other thrombophilia: Secondary | ICD-10-CM | POA: Diagnosis not present

## 2024-06-19 DIAGNOSIS — I480 Paroxysmal atrial fibrillation: Secondary | ICD-10-CM

## 2024-06-19 MED ORDER — RIVAROXABAN 20 MG PO TABS: 20.0000 mg | ORAL_TABLET | Freq: Every day | ORAL | 2 refills | Status: AC

## 2024-06-19 NOTE — Patient Instructions (Addendum)
 Medication Instructions:  Please start Xarelto  20 mg once daily 1 month before ablation. Continue all other medications as listed.  *If you need a refill on your cardiac medications before your next appointment, please call your pharmacy*  Testing/Procedures: Your physician has requested that you have an echocardiogram. Echocardiography is a painless test that uses sound waves to create images of your heart. It provides your doctor with information about the size and shape of your heart and how well your heart's chambers and valves are working. This procedure takes approximately one hour. There are no restrictions for this procedure. Please do NOT wear cologne, perfume, aftershave, or lotions (deodorant is allowed). Please arrive 15 minutes prior to your appointment time.  Please note: We ask at that you not bring children with you during ultrasound (echo/ vascular) testing. Due to room size and safety concerns, children are not allowed in the ultrasound rooms during exams. Our front office staff cannot provide observation of children in our lobby area while testing is being conducted. An adult accompanying a patient to their appointment will only be allowed in the ultrasound room at the discretion of the ultrasound technician under special circumstances. We apologize for any inconvenience.  Follow-Up: At [facility], you and your health needs are our priority.  As part of our continuing mission to provide you with exceptional heart care, our providers are all part of one team.  This team includes your primary Cardiologist (physician) and Advanced Practice Providers or APPs (Physician Assistants and Nurse Practitioners) who all work together to provide you with the care you need, when you need it.  Your next appointment:   Follow up to be determined.  We recommend signing up for the patient portal called MyChart.  Sign up information is provided on this After Visit Summary.  MyChart is  used to connect with patients for Virtual Visits (Telemedicine).  Patients are able to view lab/test results, encounter notes, upcoming appointments, etc.  Non-urgent messages can be sent to your provider as well.   To learn more about what you can do with MyChart, go to [email or web address].   Lab Work: Pre procedure labs -- we will call you to schedule:  BMP & CBC  If you have a lab test that is abnormal and we need to change your treatment, we will call you to review the results -- otherwise no news is good news.   Testing/Procedures: Your physician has recommended that you have an ablation. Catheter ablation is a medical procedure used to treat some cardiac arrhythmias (irregular heartbeats). During catheter ablation, a long, thin, flexible tube is put into a blood vessel in your groin (upper thigh), or neck. This tube is called an ablation catheter. It is then guided to your heart through the blood vessel. Radio frequency waves destroy small areas of heart tissue where abnormal heartbeats may cause an arrhythmia to start. Please review the information below on ablation and after care.  Your ablation is scheduled for   1/15/525. Please arrive at [facility] at      am.  We will call/send instructions at a later date.   Follow-Up: At [facility], you and your health needs are our priority.  As part of our continuing mission to provide you with exceptional heart care, we have created designated Provider Care Teams.  These Care Teams include your primary Cardiologist (physician) and Advanced Practice Providers (APPs -  Physician Assistants and Nurse Practitioners) who all work together to provide you with  the care you need, when you need it.  Your next appointment:   1 month(s) after your ablation  The format for your next appointment:   In Person  Provider:   AFib clinic   Thank you for choosing [facility]!!   Maeola Domino, RN [phone]    Other  Instructions   Cardiac Ablation Cardiac ablation is a procedure to destroy (ablate) some heart tissue that is sending bad signals. These bad signals cause problems in heart rhythm. The heart has many areas that make these signals. If there are problems in these areas, they can make the heart beat in a way that is not normal. Destroying some tissues can help make the heart rhythm normal. Tell your doctor about: Any allergies you have. All medicines you are taking. These include vitamins, herbs, eye drops, creams, and over-the-counter medicines. Any problems you or family members have had with medicines that make you fall asleep (anesthetics). Any blood disorders you have. Any surgeries you have had. Any medical conditions you have, such as kidney failure. Whether you are pregnant or may be pregnant. What are the risks? This is a safe procedure. But problems may occur, including: Infection. Bruising and bleeding. Bleeding into the chest. Stroke or blood clots. Damage to nearby areas of your body. Allergies to medicines or dyes. The need for a pacemaker if the normal system is damaged. Failure of the procedure to treat the problem. What happens before the procedure? Medicines Ask your doctor about: Changing or stopping your normal medicines. This is important. Taking aspirin  and ibuprofen. Do not take these medicines unless your doctor tells you to take them. Taking other medicines, vitamins, herbs, and supplements. General instructions Follow instructions from your doctor about what you cannot eat or drink. Plan to have someone take you home from the hospital or clinic. If you will be going home right after the procedure, plan to have someone with you for 24 hours. Ask your doctor what steps will be taken to prevent infection. What happens during the procedure?  An IV tube will be put into one of your veins. You will be given a medicine to help you relax. The skin on your neck or  groin will be numbed. A cut (incision) will be made in your neck or groin. A needle will be put through your cut and into a large vein. A tube (catheter) will be put into the needle. The tube will be moved to your heart. Dye may be put through the tube. This helps your doctor see your heart. Small devices (electrodes) on the tube will send out signals. A type of energy will be used to destroy some heart tissue. The tube will be taken out. Pressure will be held on your cut. This helps stop bleeding. A bandage will be put over your cut. The exact procedure may vary among doctors and hospitals. What happens after the procedure? You will be watched until you leave the hospital or clinic. This includes checking your heart rate, breathing rate, oxygen, and blood pressure. Your cut will be watched for bleeding. You will need to lie still for a few hours. Do not drive for 24 hours or as long as your doctor tells you. Summary Cardiac ablation is a procedure to destroy some heart tissue. This is done to treat heart rhythm problems. Tell your doctor about any medical conditions you may have. Tell him or her about all medicines you are taking to treat them. This is a safe  procedure. But problems may occur. These include infection, bruising, bleeding, and damage to nearby areas of your body. Follow what your doctor tells you about food and drink. You may also be told to change or stop some of your medicines. After the procedure, do not drive for 24 hours or as long as your doctor tells you. This information is not intended to replace advice given to you by your health care provider. Make sure you discuss any questions you have with your health care provider. Document Revised: 10/01/2021 Document Reviewed: 06/13/2019 Elsevier Patient Education  2023 Elsevier Inc.   Cardiac Ablation, Care After  This sheet gives you information about how to care for yourself after your procedure. Your health care  provider may also give you more specific instructions. If you have problems or questions, contact your health care provider. What can I expect after the procedure? After the procedure, it is common to have: Bruising around your puncture site. Tenderness around your puncture site. Skipped heartbeats. If you had an atrial fibrillation ablation, you may have atrial fibrillation during the first several months after your procedure.  Tiredness (fatigue).  Follow these instructions at home: Puncture site care  Follow instructions from your health care provider about how to take care of your puncture site. Make sure you: If present, leave stitches (sutures), skin glue, or adhesive strips in place. These skin closures may need to stay in place for up to 2 weeks. If adhesive strip edges start to loosen and curl up, you may trim the loose edges. Do not remove adhesive strips completely unless your health care provider tells you to do that. If a large square bandage is present, this may be removed 24 hours after surgery.  Check your puncture site every day for signs of infection. Check for: Redness, swelling, or pain. Fluid or blood. If your puncture site starts to bleed, lie down on your back, apply firm pressure to the area, and contact your health care provider. Warmth. Pus or a bad smell. A pea or marble sized lump/knot at the site is normal and can take up to three months to resolve.  Driving Do not drive for at least 4 days after your procedure or however long your health care provider recommends. (Do not resume driving if you have previously been instructed not to drive for other health reasons.) Do not drive or use heavy machinery while taking prescription pain medicine. Activity Avoid activities that take a lot of effort for at least 7 days after your procedure. Do not lift anything that is heavier than 5 lb (4.5 kg) for one week.  No sexual activity for 1 week.  Return to your normal  activities as told by your health care provider. Ask your health care provider what activities are safe for you. General instructions Take over-the-counter and prescription medicines only as told by your health care provider. Do not use any products that contain nicotine or tobacco, such as cigarettes and e-cigarettes. If you need help quitting, ask your health care provider. You may shower after 24 hours, but Do not take baths, swim, or use a hot tub for 1 week.  Do not drink alcohol for 24 hours after your procedure. Keep all follow-up visits as told by your health care provider. This is important. Contact a health care provider if: You have redness, mild swelling, or pain around your puncture site. You have fluid or blood coming from your puncture site that stops after applying firm pressure to the  area. Your puncture site feels warm to the touch. You have pus or a bad smell coming from your puncture site. You have a fever. You have chest pain or discomfort that spreads to your neck, jaw, or arm. You have chest pain that is worse with lying on your back or taking a deep breath. You are sweating a lot. You feel nauseous. You have a fast or irregular heartbeat. You have shortness of breath. You are dizzy or light-headed and feel the need to lie down. You have pain or numbness in the arm or leg closest to your puncture site. Get help right away if: Your puncture site suddenly swells. Your puncture site is bleeding and the bleeding does not stop after applying firm pressure to the area. These symptoms may represent a serious problem that is an emergency. Do not wait to see if the symptoms will go away. Get medical help right away. Call your local emergency services (911 in the [place] Do not drive yourself to the hospital. Summary After the procedure, it is normal to have bruising and tenderness at the puncture site in your groin, neck, or forearm. Check your puncture site every day for  signs of infection. Get help right away if your puncture site is bleeding and the bleeding does not stop after applying firm pressure to the area. This is a medical emergency. This information is not intended to replace advice given to you by your health care provider. Make sure you discuss any questions you have with your health care provider.

## 2024-06-24 ENCOUNTER — Other Ambulatory Visit: Payer: Self-pay

## 2024-06-24 DIAGNOSIS — I480 Paroxysmal atrial fibrillation: Secondary | ICD-10-CM

## 2024-07-09 ENCOUNTER — Telehealth: Payer: Self-pay

## 2024-07-09 NOTE — Telephone Encounter (Signed)
-----   Message from Nurse Doreatha BROCKS, RN sent at 06/24/2024  9:16 AM EST ----- Regarding: 08/09/24 afib ablation  Precert:  MD: Kennyth Type of ablation: A-fib Diagnosis: afib CPT code: A-fib (06343) Ablation scheduled (date/time): 08/08/24 at 730  Procedure:  Added to calendar? Yes Orders entered? Yes Letter complete? No, >30 days before procedure Scheduled with cath lab? Yes Any medications to hold? No - needs to start xarelto  on 12/18 (this has already been sent in) Labs ordered (CBC, BMET, PT/INR if on warfarin): Yes Mapping system: Doesn't matter CARTO/OPAL rep notified? No Cardiac CT needed? No Dye allergy? No Pre-meds ordered and instructions given? No, not needed Letter method: MyChart H&P: 11/26 Device: No  Follow-up:  Cassie/Angel, please schedule Routine.  Covering RN - please send this message to [employer], EP scheduler, EP Scheduling pool, EP [employer], and CT scheduler (Brittany Lynch/Stephanie Mogg), if indicated.

## 2024-07-22 ENCOUNTER — Telehealth (HOSPITAL_COMMUNITY): Payer: Self-pay

## 2024-07-22 ENCOUNTER — Encounter (HOSPITAL_COMMUNITY): Payer: Self-pay

## 2024-07-22 NOTE — Telephone Encounter (Signed)
 Spoke with patient to discuss upcoming procedure.   CT: not required.  Labs: to be completed.   Any recent signs of acute illness or been started on antibiotics? No Any new medications started?Yes; Xarelto  Any medications to hold?  No Any missed doses of blood thinner?  Yes; Patient reports he missed a dose of Xarelto  on 12/25 and 12/26.  Will inform Dr. Kennyth and initiate TEE protocol.  Informed patient of the importance to continue taking Xarelto  (Rivaroxaban ) once daily without missing any doses and discussed multiple reminder methods to help with compliance.  Medication instructions:  On the morning of your procedure DO NOT take any medication., including Xarelto  (Rivaroxaban ) or the procedure may be rescheduled. Nothing to eat or drink after midnight prior to your procedure.  Confirmed patient is scheduled for Atrial Fibrillation Ablation on Thursday, January 15 with Dr. Kennyth. Instructed patient to arrive at the Main Entrance A at [facility]: [address] [place], [place] [address] and check in at Admitting at 5:30 AM.   Plan to go home the same day, you will only stay overnight if medically necessary. You MUST have a responsible adult to drive you home and MUST be with you the first 24 hours after you arrive home or your procedure could be cancelled.  Informed patient a nurse will call a day before the procedure to confirm arrival time and ensure instructions are followed.  Patient verbalized understanding to all instructions provided and agreed to proceed with procedure.   Advised patient to contact RN Navigator at [phone], to inform of any new medications started after call or concerns prior to procedure.

## 2024-07-23 NOTE — Telephone Encounter (Signed)
 Patient inquired if he was able to take SuperBeets while on Xarelto  or if contraindicated.    Discussed with C. Pavero, RPH, who advised So there is not a huge amount of data regarding the interaction between Beets and Xarelto . We know it interacts somewhat but is generally considered safe.

## 2024-07-24 DIAGNOSIS — I480 Paroxysmal atrial fibrillation: Secondary | ICD-10-CM | POA: Diagnosis not present

## 2024-07-25 ENCOUNTER — Ambulatory Visit: Payer: Self-pay | Admitting: Cardiology

## 2024-07-25 LAB — BASIC METABOLIC PANEL WITH GFR
BUN/Creatinine Ratio: 12 (ref 9–20)
BUN: 15 mg/dL (ref 6–24)
CO2: 21 mmol/L (ref 20–29)
Calcium: 8.9 mg/dL (ref 8.7–10.2)
Chloride: 103 mmol/L (ref 96–106)
Creatinine, Ser: 1.29 mg/dL — ABNORMAL HIGH (ref 0.76–1.27)
Glucose: 102 mg/dL — ABNORMAL HIGH (ref 70–99)
Potassium: 4.6 mmol/L (ref 3.5–5.2)
Sodium: 140 mmol/L (ref 134–144)
eGFR: 65 mL/min/1.73

## 2024-07-25 LAB — CBC
Hematocrit: 48.5 % (ref 37.5–51.0)
Hemoglobin: 16.3 g/dL (ref 13.0–17.7)
MCH: 32.2 pg (ref 26.6–33.0)
MCHC: 33.6 g/dL (ref 31.5–35.7)
MCV: 96 fL (ref 79–97)
Platelets: 256 x10E3/uL (ref 150–450)
RBC: 5.06 x10E6/uL (ref 4.14–5.80)
RDW: 12.2 % (ref 11.6–15.4)
WBC: 6.9 x10E3/uL (ref 3.4–10.8)

## 2024-07-30 ENCOUNTER — Ambulatory Visit (HOSPITAL_COMMUNITY)
Admission: RE | Admit: 2024-07-30 | Discharge: 2024-07-30 | Disposition: A | Discharge status: Home / Self Care | Source: Ambulatory Visit | Attending: Cardiology | Admitting: Cardiology

## 2024-07-30 DIAGNOSIS — I4891 Unspecified atrial fibrillation: Secondary | ICD-10-CM

## 2024-07-30 DIAGNOSIS — I480 Paroxysmal atrial fibrillation: Secondary | ICD-10-CM | POA: Diagnosis present

## 2024-07-30 LAB — ECHOCARDIOGRAM COMPLETE
Area-P 1/2: 4.26 cm2
S' Lateral: 2.7 cm

## 2024-08-07 NOTE — Pre-Procedure Instructions (Signed)
 Instructed patient on the following items: Arrival time 0515 Nothing to eat or drink after midnight No meds AM of procedure Responsible person to drive you home and stay with you for 24 hrs  Have you missed any doses of anti-coagulant Xarelto - takes once a day, did miss dose around Christmas.

## 2024-08-08 ENCOUNTER — Ambulatory Visit (HOSPITAL_COMMUNITY): Admission: RE | Disposition: A | Payer: Self-pay | Source: Home / Self Care | Attending: Cardiology

## 2024-08-08 ENCOUNTER — Ambulatory Visit (HOSPITAL_COMMUNITY): Admitting: Anesthesiology

## 2024-08-08 ENCOUNTER — Ambulatory Visit (HOSPITAL_COMMUNITY)
Admission: RE | Admit: 2024-08-08 | Discharge: 2024-08-08 | Disposition: A | Attending: Cardiology | Admitting: Cardiology

## 2024-08-08 ENCOUNTER — Encounter (HOSPITAL_COMMUNITY): Payer: Self-pay | Admitting: Cardiology

## 2024-08-08 ENCOUNTER — Other Ambulatory Visit: Payer: Self-pay

## 2024-08-08 DIAGNOSIS — D6869 Other thrombophilia: Secondary | ICD-10-CM | POA: Insufficient documentation

## 2024-08-08 DIAGNOSIS — I480 Paroxysmal atrial fibrillation: Secondary | ICD-10-CM | POA: Diagnosis present

## 2024-08-08 DIAGNOSIS — Z79899 Other long term (current) drug therapy: Secondary | ICD-10-CM | POA: Diagnosis not present

## 2024-08-08 DIAGNOSIS — Z7901 Long term (current) use of anticoagulants: Secondary | ICD-10-CM | POA: Diagnosis not present

## 2024-08-08 DIAGNOSIS — K469 Unspecified abdominal hernia without obstruction or gangrene: Secondary | ICD-10-CM | POA: Insufficient documentation

## 2024-08-08 HISTORY — PX: ATRIAL FIBRILLATION ABLATION: EP1191

## 2024-08-08 LAB — POCT ACTIVATED CLOTTING TIME: Activated Clotting Time: 276 s

## 2024-08-08 MED ORDER — ATROPINE SULFATE 1 MG/ML IV SOLN
INTRAVENOUS | Status: DC | PRN
  Administered 2024-08-08: 1 mg via INTRAVENOUS

## 2024-08-08 MED ORDER — RIVAROXABAN 20 MG PO TABS
20.0000 mg | ORAL_TABLET | Freq: Once | ORAL | Status: AC
  Administered 2024-08-08: 20 mg via ORAL
  Filled 2024-08-08: qty 1

## 2024-08-08 MED ORDER — MIDAZOLAM HCL 2 MG/2ML IJ SOLN
INTRAMUSCULAR | Status: AC
  Filled 2024-08-08: qty 2

## 2024-08-08 MED ORDER — PROTAMINE SULFATE 10 MG/ML IV SOLN
INTRAVENOUS | Status: DC | PRN
  Administered 2024-08-08: 15 mg via INTRAVENOUS
  Administered 2024-08-08: 25 mg via INTRAVENOUS

## 2024-08-08 MED ORDER — MIDAZOLAM HCL (PF) 2 MG/2ML IJ SOLN
INTRAMUSCULAR | Status: DC | PRN
  Administered 2024-08-08: 2 mg via INTRAVENOUS

## 2024-08-08 MED ORDER — SODIUM CHLORIDE 0.9 % IV SOLN: 250.0000 mL | INTRAVENOUS | Status: DC | PRN

## 2024-08-08 MED ORDER — DEXAMETHASONE SOD PHOSPHATE PF 10 MG/ML IJ SOLN
INTRAMUSCULAR | Status: DC | PRN
  Administered 2024-08-08: 10 mg via INTRAVENOUS

## 2024-08-08 MED ORDER — PROPOFOL 10 MG/ML IV BOLUS
INTRAVENOUS | Status: DC | PRN
  Administered 2024-08-08: 170 mg via INTRAVENOUS

## 2024-08-08 MED ORDER — PHENYLEPHRINE HCL-NACL 20-0.9 MG/250ML-% IV SOLN
INTRAVENOUS | Status: DC | PRN
  Administered 2024-08-08: 20 ug/min via INTRAVENOUS

## 2024-08-08 MED ORDER — ACETAMINOPHEN 325 MG PO TABS: 650.0000 mg | ORAL_TABLET | ORAL | Status: DC | PRN

## 2024-08-08 MED ORDER — SUGAMMADEX SODIUM 200 MG/2ML IV SOLN
INTRAVENOUS | Status: DC | PRN
  Administered 2024-08-08: 200 mg via INTRAVENOUS

## 2024-08-08 MED ORDER — ROCURONIUM BROMIDE 10 MG/ML (PF) SYRINGE
PREFILLED_SYRINGE | INTRAVENOUS | Status: DC | PRN
  Administered 2024-08-08: 50 mg via INTRAVENOUS
  Administered 2024-08-08: 30 mg via INTRAVENOUS

## 2024-08-08 MED ORDER — ATROPINE SULFATE 1 MG/10ML IJ SOSY
PREFILLED_SYRINGE | INTRAMUSCULAR | Status: AC
  Filled 2024-08-08: qty 10

## 2024-08-08 MED ORDER — ONDANSETRON HCL 4 MG/2ML IJ SOLN: 4.0000 mg | Freq: Four times a day (QID) | INTRAMUSCULAR | Status: DC | PRN

## 2024-08-08 MED ORDER — SODIUM CHLORIDE 0.9 % IV SOLN: INTRAVENOUS | Status: DC

## 2024-08-08 MED ORDER — SODIUM CHLORIDE 0.9% FLUSH: 3.0000 mL | Freq: Two times a day (BID) | INTRAVENOUS | Status: DC

## 2024-08-08 MED ORDER — HEPARIN (PORCINE) IN NACL 1000-0.9 UT/500ML-% IV SOLN
INTRAVENOUS | Status: DC | PRN
  Administered 2024-08-08 (×3): 500 mL

## 2024-08-08 MED ORDER — FENTANYL CITRATE (PF) 100 MCG/2ML IJ SOLN
INTRAMUSCULAR | Status: DC | PRN
  Administered 2024-08-08 (×2): 50 ug via INTRAVENOUS

## 2024-08-08 MED ORDER — HEPARIN SODIUM (PORCINE) 1000 UNIT/ML IJ SOLN
INTRAMUSCULAR | Status: DC | PRN
  Administered 2024-08-08: 16000 [IU] via INTRAVENOUS

## 2024-08-08 MED ORDER — FENTANYL CITRATE (PF) 100 MCG/2ML IJ SOLN
INTRAMUSCULAR | Status: AC
  Filled 2024-08-08: qty 2

## 2024-08-08 MED ORDER — HEPARIN SODIUM (PORCINE) 1000 UNIT/ML IJ SOLN
INTRAMUSCULAR | Status: AC
  Filled 2024-08-08: qty 20

## 2024-08-08 MED ORDER — LIDOCAINE 2% (20 MG/ML) 5 ML SYRINGE
INTRAMUSCULAR | Status: DC | PRN
  Administered 2024-08-08: 100 mg via INTRAVENOUS

## 2024-08-08 MED ORDER — SODIUM CHLORIDE 0.9% FLUSH: 3.0000 mL | INTRAVENOUS | Status: DC | PRN

## 2024-08-08 MED ORDER — ONDANSETRON HCL 4 MG/2ML IJ SOLN
INTRAMUSCULAR | Status: DC | PRN
  Administered 2024-08-08: 4 mg via INTRAVENOUS

## 2024-08-08 MED ORDER — PHENYLEPHRINE 80 MCG/ML (10ML) SYRINGE FOR IV PUSH (FOR BLOOD PRESSURE SUPPORT)
PREFILLED_SYRINGE | INTRAVENOUS | Status: DC | PRN
  Administered 2024-08-08: 160 ug via INTRAVENOUS

## 2024-08-08 NOTE — Progress Notes (Signed)
" °  Pt arrived from EP via Bed to HA 18. Report received from RN and CRNA. Pt vitals are stable, performed q82min see vitals flowsheet. Anesthesia recovery has been uneventful. Pt to transition to short stay for remainder of recovery. Will continue to monitor patient while under holding area care.     "

## 2024-08-08 NOTE — Progress Notes (Signed)
 Patient and patient wife given discharge instructions, education provided no further questions at this time. Patient able to ambulate and void before discharge. Able to tolerate PO intake. Patient site is clean, dry, intact with no hematoma noted upon discharge. Verified with MD when to resume blood thinner medication, follow-up appointment made for pt.

## 2024-08-08 NOTE — H&P (Signed)
 "  Electrophysiology Note:   Date:  08/08/24 ID:  Ricky Arnold, DOB July 05, 1966, MRN [id number]   Primary Cardiologist: None Electrophysiologist: Fonda Kitty, MD       History of Present Illness:   Ricky Arnold is a 59 y.o. male with h/o paroxysmal atrial fibrillation who is being seen today for evaluation for catheter ablation.     Discussed the use of AI scribe software for clinical note transcription with the patient, who gave verbal consent to proceed.   History of Present Illness Ricky Arnold is a 59 year old male with atrial fibrillation who presents for evaluation of increased frequency of episodes and consideration of ablation.   He has had atrial fibrillation since age 43, initially experiencing one or two episodes per year, often around the holidays and associated with stress. Recently, there has been an increase in the frequency and duration of episodes. He manages his condition with flecainide , which he uses for emergencies. He is not currently on a blood thinner.   He is concerned about the impact of the procedure on his ability to resume physical activity, particularly running.   He is also dealing with a hernia and has consulted a careers adviser about it. He is considering the timing of the hernia repair in relation to the potential atrial fibrillation ablation procedure.   He has not had an ultrasound of his heart since 2016.   Interval: Patient presents today for planned ablation. Reports feeling relatively well. No new or acute complaints.   Review of systems complete and found to be negative unless listed in HPI.    EP Information / Studies Reviewed:     EKG Interpretation Date/Time:                  Wednesday June 19 2024 15:40:54 EST Ventricular Rate:         91 PR Interval:                 160 QRS Duration:             96 QT Interval:                 340 QTC Calculation:418 R Axis:                         -4   Text Interpretation:Normal sinus rhythm  Incomplete right bundle branch block When compared with ECG of 30-May-2024 11:40, No significant change was found Confirmed by Kitty Fonda [phone] on 06/19/2024 3:47:34 PM    ECG 12/17/23: AF    Echo 07/2014: Study Conclusions   - Left ventricle: The cavity size was normal. Wall thickness was    normal. Systolic function was normal. The estimated ejection    fraction was in the range of 55% to 60%. Left ventricular    diastolic function parameters were normal.  - Right atrium: The atrium was mildly dilated.    Risk Assessment/Calculations:     CHA2DS2-VASc Score = 0   This indicates a 0.2% annual risk of stroke. The patient's score is based upon: CHF History: 0 HTN History: 0 Diabetes History: 0 Stroke History: 0 Vascular Disease History: 0 Age Score: 0 Gender Score: 0             Physical Exam:    Today's Vitals   08/08/24 0529  BP: (!) 141/89  Pulse: 77  Resp: 16  Temp: 98.1 F (36.7 C)  SpO2: 97%  Weight: 104.3 kg  Height: 5' 8 (1.727 m)  PainSc: 0-No pain   Body mass index is 34.97 kg/m.   General: Well developed, in no acute distress.  Neck: No JVD.  Cardiac: Normal rate, regular rhythm.  Resp: Normal work of breathing.  Ext: No edema.  Neuro: No gross focal deficits.  Psych: Normal affect.      ASSESSMENT AND PLAN:     #Paroxysmal atrial fibrillation: Symptomatic.  Increasing frequency and duration of episodes. #High risk medication use: Flecainide .  Normal PR and QRS intervals. -Discussed treatment options today for AF including antiarrhythmic drug therapy and ablation. Discussed risks, recovery and likelihood of success with each treatment strategy. Risk, benefits, and alternatives to EP study and ablation for afib were discussed. These risks include but are not limited to stroke, bleeding, vascular damage, tamponade, perforation, damage to the esophagus, lungs, phrenic nerve and other structures, pulmonary vein stenosis, worsening renal function,  coronary vasospasm and death.  Discussed potential need for repeat ablation procedures and antiarrhythmic drugs after an initial ablation. The patient understands these risk and wishes to proceed.  We will therefore proceed with catheter ablation today. -Will order an updated echocardiogram prior to ablation. - Continue diltiazem  120 mg once daily. - Continue flecainide  as pill in pocket.   #Hypercoagulable state due to AF: CHADSVASC score of 0. - Patient will need to start oral anticoagulation in order for catheter ablation to be performed.  He will start Xarelto  4 weeks prior to ablation and continue for 60 days after.     Follow up with Dr. Kennyth 3 months after catheter ablation.    Signed, Fonda Kennyth, MD      "

## 2024-08-08 NOTE — Anesthesia Preprocedure Evaluation (Addendum)
"                                    Anesthesia Evaluation  Patient identified by MRN, date of birth, ID band Patient awake    Reviewed: Allergy & Precautions, NPO status , Patient's Chart, lab work & pertinent test results  History of Anesthesia Complications Negative for: history of anesthetic complications  Airway Mallampati: III  TM Distance: >3 FB Neck ROM: Full    Dental no notable dental hx. (+) Teeth Intact   Pulmonary neg pulmonary ROS, neg sleep apnea, neg COPD, Patient abstained from smoking.Not current smoker   Pulmonary exam normal breath sounds clear to auscultation       Cardiovascular Exercise Tolerance: Good METS(-) hypertension(-) CAD and (-) Past MI + dysrhythmias  Rhythm:Regular Rate:Normal - Systolic murmurs 1. Left ventricular ejection fraction, by estimation, is 70 to 75%. The  left ventricle has hyperdynamic function. The left ventricle has no  regional wall motion abnormalities. Left ventricular diastolic parameters  were normal.   2. Right ventricular systolic function is low normal. The right  ventricular size is mildly enlarged.   3. Left atrial size was mildly dilated.   4. The mitral valve is normal in structure. Trivial mitral valve  regurgitation.   5. The aortic valve is tricuspid. Aortic valve regurgitation is not  visualized.   6. The inferior vena cava is normal in size with greater than 50%  respiratory variability, suggesting right atrial pressure of 3 mmHg.     Neuro/Psych negative neurological ROS  negative psych ROS   GI/Hepatic ,neg GERD  ,,(+)     (-) substance abuse    Endo/Other  neg diabetes    Renal/GU negative Renal ROS     Musculoskeletal   Abdominal  (+) + obese  Peds  Hematology   Anesthesia Other Findings Past Medical History: 08/20/14: Arrhythmia     Comment:  afib  Reproductive/Obstetrics                              Anesthesia Physical Anesthesia Plan  ASA:  3  Anesthesia Plan: General   Post-op Pain Management: Minimal or no pain anticipated   Induction: Intravenous  PONV Risk Score and Plan: 2 and Ondansetron , Dexamethasone  and Midazolam   Airway Management Planned: Oral ETT  Additional Equipment: None  Intra-op Plan:   Post-operative Plan: Extubation in OR  Informed Consent: I have reviewed the patients History and Physical, chart, labs and discussed the procedure including the risks, benefits and alternatives for the proposed anesthesia with the patient or authorized representative who has indicated his/her understanding and acceptance.     Dental advisory given  Plan Discussed with: CRNA and Surgeon  Anesthesia Plan Comments: (Discussed risks of anesthesia with patient, including PONV, sore throat, lip/dental/eye damage. Rare risks discussed as well, such as cardiorespiratory and neurological sequelae, and allergic reactions. Discussed the role of CRNA in patient's perioperative care. Patient understands.)        Anesthesia Quick Evaluation  "

## 2024-08-08 NOTE — Discharge Instructions (Signed)

## 2024-08-08 NOTE — Anesthesia Procedure Notes (Signed)
 Procedure Name: Intubation Date/Time: 08/08/2024 7:42 AM  Performed by: Genny Gun, CRNAPre-anesthesia Checklist: Patient identified, Emergency Drugs available, Suction available, Patient being monitored and Timeout performed Patient Re-evaluated:Patient Re-evaluated prior to induction Oxygen Delivery Method: Circle system utilized Preoxygenation: Pre-oxygenation with 100% oxygen Induction Type: IV induction Ventilation: Mask ventilation without difficulty and Oral airway inserted - appropriate to patient size Laryngoscope Size: Mac and 4 Grade View: Grade I Tube type: Oral Tube size: 7.5 mm Number of attempts: 1 Placement Confirmation: ETT inserted through vocal cords under direct vision, positive ETCO2 and breath sounds checked- equal and bilateral Secured at: 22 cm Tube secured with: Tape Dental Injury: Teeth and Oropharynx as per pre-operative assessment

## 2024-08-08 NOTE — Anesthesia Postprocedure Evaluation (Signed)
"   Anesthesia Post Note  Patient: Ricky Arnold  Procedure(s) Performed: ATRIAL FIBRILLATION ABLATION     Patient location during evaluation: PACU Anesthesia Type: General Level of consciousness: awake and alert Pain management: pain level controlled Vital Signs Assessment: post-procedure vital signs reviewed and stable Respiratory status: spontaneous breathing, nonlabored ventilation, respiratory function stable and patient connected to nasal cannula oxygen Cardiovascular status: blood pressure returned to baseline and stable Postop Assessment: no apparent nausea or vomiting Anesthetic complications: no   There were no known notable events for this encounter.  Last Vitals:  Vitals:   08/08/24 1015 08/08/24 1030  BP: 99/66 105/65  Pulse: 81 73  Resp: 16 13  Temp:    SpO2: 96% 96%    Last Pain:  Vitals:   08/08/24 1000  TempSrc:   PainSc: 0-No pain                 Rome Ade      "

## 2024-08-08 NOTE — Transfer of Care (Signed)
 Immediate Anesthesia Transfer of Care Note  Patient: Ricky Arnold  Procedure(s) Performed: ATRIAL FIBRILLATION ABLATION  Patient Location: PACU and Cath Lab  Anesthesia Type:General  Level of Consciousness: awake, alert , oriented, and patient cooperative  Airway & Oxygen Therapy: Patient Spontanous Breathing and Patient connected to nasal cannula oxygen  Post-op Assessment: Report given to RN and Post -op Vital signs reviewed and stable  Post vital signs: Reviewed and stable  Last Vitals:  Vitals Value Taken Time  BP    Temp    Pulse 82 08/08/24 09:18  Resp 12 08/08/24 09:18  SpO2 95 % 08/08/24 09:18  Vitals shown include unfiled device data.  Last Pain:  Vitals:   08/08/24 0529  PainSc: 0-No pain         Complications: There were no known notable events for this encounter.

## 2024-08-09 ENCOUNTER — Telehealth (HOSPITAL_COMMUNITY): Payer: Self-pay

## 2024-08-09 MED FILL — Atropine Sulfate Soln Prefill Syr 1 MG/10ML (0.1 MG/ML): INTRAMUSCULAR | Qty: 10 | Status: AC

## 2024-08-09 NOTE — Telephone Encounter (Signed)
 Attempted to reach patient to follow up with procedure completed on 08/08/24, no answer. Unable to leave message, VM not setup.

## 2024-08-12 ENCOUNTER — Telehealth: Payer: Self-pay | Admitting: Cardiology

## 2024-08-12 ENCOUNTER — Ambulatory Visit (HOSPITAL_COMMUNITY)
Admission: RE | Admit: 2024-08-12 | Discharge: 2024-08-12 | Disposition: A | Discharge status: Home / Self Care | Source: Ambulatory Visit | Attending: Physician Assistant | Admitting: Physician Assistant

## 2024-08-12 ENCOUNTER — Encounter (HOSPITAL_COMMUNITY): Payer: Self-pay | Admitting: Physician Assistant

## 2024-08-12 VITALS — BP 100/78 | HR 146 | Ht 68.0 in | Wt 232.0 lb

## 2024-08-12 DIAGNOSIS — I484 Atypical atrial flutter: Secondary | ICD-10-CM

## 2024-08-12 DIAGNOSIS — Z5181 Encounter for therapeutic drug level monitoring: Secondary | ICD-10-CM | POA: Diagnosis not present

## 2024-08-12 DIAGNOSIS — Z79899 Other long term (current) drug therapy: Secondary | ICD-10-CM

## 2024-08-12 DIAGNOSIS — I480 Paroxysmal atrial fibrillation: Secondary | ICD-10-CM

## 2024-08-12 DIAGNOSIS — E66811 Obesity, class 1: Secondary | ICD-10-CM

## 2024-08-12 NOTE — Telephone Encounter (Signed)
 Spoke with pt who reports HR is now down to 130.  Pt will plan to keep Afib Clinic appt as scheduled for today.  Pt thanked CHARITY FUNDRAISER for the callback.

## 2024-08-12 NOTE — Patient Instructions (Signed)
 Continue cardizem  120mg  daily   Cardioversion scheduled for: Tuesday January 20th   - Arrive at the [employer] [facility] ([address])  and check in with ADMITTING at 11:00 AM   - Do not eat or drink anything after midnight the night prior to your procedure.   - Take all your morning medication (except diabetic medications) with a sip of water prior to arrival.  - Do NOT miss any doses of your blood thinner - if you should miss a dose or take a dose more than 4 hours late -- please notify our office immediately.  - You will not be able to drive home after your procedure. Please ensure you have a responsible adult to drive you home. You will need someone with you for 24 hours post procedure.     - Expect to be in the procedural area approximately 2 hours.   - If you feel as if you go back into normal rhythm prior to scheduled cardioversion, please notify our office immediately.   If your procedure is canceled in the cardioversion suite you will be charged a cancellation fee.

## 2024-08-12 NOTE — Telephone Encounter (Signed)
 Spoke with pt who reports intermittent Afib since Friday with high heart rates.  Current rate is 169.  Pt has Flecainide  and Dilt on medication list but has not tried either as he was unsure what to do.  Current HR 122/94.  Denies current CP, SOB or dizziness. Consulted with Glade Pepper, RN and Daril Kicks, PA-C, Afib Clinic.  Per PA-C, have him take a dose of diltiazem  120 mg now and flecainide  300 mg 30 minutes later to see if it will convert him. if not, we can see him at 3:30 to set up DCCV. He should continue diltiazem  120 mg daily for now. he should not take any more flecainide  for 4 days after this dose. Pt verbalizes understanding and will update clinic accordingly.

## 2024-08-12 NOTE — Progress Notes (Signed)
 "   Primary Care Physician: Pcp, No Primary Cardiologist: Dr Burnard (remotely) Primary Electrophysiologist: Dr Kennyth Referring Physician: Dr Burnard Drivers Ricky Arnold is a 59 y.o. male with a history of atrial fibrillation who presents for consultation in the [facility].  The patient was initially diagnosed with atrial fibrillation 08/2014 after presenting to the ED and was chemically converted with flecainide . Seen by Arland Don remotely. Patient has a CHADS2VASC score of 0. He has done well for several years using PIP flecainide . He typically has an episode between 1-3 years which responds quickly to flecainide . He was seen at [facility] 01/04/22 for EP evaluation but decided not to pursue treatment at that time. He was seen by Dr Kennyth and underwent afib ablation on 08/08/24.  Patient returns for follow up for atrial fibrillation. Patient called triage this AM with rapid heart rates. He was asymptomatic. Patient was instructed to take diltiazem  and flecainide . Unfortunately, he remains in what appears to be rapid atrial flutter. He first noticed elevated heart rates the day after his ablation. His groin sites are healing well. No missed doses of anticoagulation since ablation.   Today, he  denies symptoms of palpitations, chest pain, shortness of breath, orthopnea, PND, lower extremity edema, dizziness, presyncope, syncope, snoring, daytime somnolence, bleeding, or neurologic sequela. The patient is tolerating medications without difficulties and is otherwise without complaint today.    Atrial Fibrillation Risk Factors:  he does not have symptoms or diagnosis of sleep apnea. Sleep study 2016 he does not have a history of rheumatic fever. he does have a history of alcohol use. The patient does have a history of early familial atrial fibrillation or other arrhythmias.  Atrial Fibrillation Management history:  Previous antiarrhythmic drugs: flecainide   Previous  cardioversions: none Previous ablations: 08/08/24 Anticoagulation history: Xarelto    Past Medical History:  Diagnosis Date   Arrhythmia 08/20/14   afib    Current Outpatient Medications  Medication Sig Dispense Refill   Carboxymethylcellul-Glycerin (CLEAR EYES FOR DRY EYES OP) Place 1 drop into both eyes daily.     Cholecalciferol (VITAMIN D) 50 MCG (2000 UT) CAPS Take 2,000 Units by mouth daily.     Coenzyme Q10 (CO Q 10) 100 MG CAPS Take 100 mg by mouth every morning.     diltiazem  (CARDIZEM  CD) 120 MG 24 hr capsule Take 1 capsule (120 mg total) by mouth daily. 30 capsule 1   flecainide  (TAMBOCOR ) 150 MG tablet Take 2 tablets (300 mg total) by mouth once for 1 dose. May repeat every 4 days (Patient taking differently: Take 300 mg by mouth as needed (afib). May repeat every 4 days) 10 tablet 3   MAGNESIUM PO Take 500 mg by mouth daily.     Misc Natural Products (BEET ROOT PO) Take 1 Scoop by mouth daily. Super Beets     Multiple Vitamin (MULTIVITAMIN WITH MINERALS) TABS tablet Take 2 tablets by mouth daily. Pure Nature     OVER THE COUNTER MEDICATION Take 2 oz by mouth daily. Dose for your liver     rivaroxaban  (XARELTO ) 20 MG TABS tablet Take 1 tablet (20 mg total) by mouth daily with supper. 90 tablet 2   vitamin B-12 (CYANOCOBALAMIN) 100 MCG tablet Take 100 mcg by mouth daily.     No current facility-administered medications for this encounter.    ROS- All systems are reviewed and negative except as per the HPI above.  Physical Exam: Vitals:   08/12/24 1535  BP:  100/78  Pulse: (!) 146  SpO2: 97%  Weight: 105.2 kg  Height: 5' 8 (1.727 m)    GEN: Well nourished, well developed in no acute distress CARDIAC: Regular rhythm, tachycardia, no murmurs, rubs, gallops RESPIRATORY:  Clear to auscultation without rales, wheezing or rhonchi  ABDOMEN: Soft, non-tender, non-distended EXTREMITIES:  No edema; No deformity    Wt Readings from Last 3 Encounters:  08/12/24 105.2 kg   08/08/24 104.3 kg  06/19/24 108 kg    EKG Interpretation Date/Time:  Monday August 12 2024 15:33:12 EST Ventricular Rate:  146 PR Interval:  160 QRS Duration:  128 QT Interval:  280 QTC Calculation: 436 R Axis:   -47  Text Interpretation: Atrial flutter Left axis deviation Right bundle branch block Cannot rule out Inferior infarct , age undetermined Abnormal ECG When compared with ECG of 08-Aug-2024 09:24, Atrial flutter has replaced Sinus rhythm Confirmed by Hester Joslin ([id number]) on 08/12/2024 3:50:51 PM    Echo 07/30/24 demonstrated   1. Left ventricular ejection fraction, by estimation, is 70 to 75%. The  left ventricle has hyperdynamic function. The left ventricle has no  regional wall motion abnormalities. Left ventricular diastolic parameters  were normal.   2. Right ventricular systolic function is low normal. The right  ventricular size is mildly enlarged.   3. Left atrial size was mildly dilated.   4. The mitral valve is normal in structure. Trivial mitral valve  regurgitation.   5. The aortic valve is tricuspid. Aortic valve regurgitation is not  visualized.   6. The inferior vena cava is normal in size with greater than 50%  respiratory variability, suggesting right atrial pressure of 3 mmHg.   Epic records are reviewed at length today   CHA2DS2-VASc Score = 0  The patient's score is based upon: CHF History: 0 HTN History: 0 Diabetes History: 0 Stroke History: 0 Vascular Disease History: 0 Age Score: 0 Gender Score: 0       ASSESSMENT AND PLAN: Paroxysmal Atrial Fibrillation/atrial flutter (ICD10:  I48.0) The patient's CHA2DS2-VASc score is 0, indicating a 0.2% annual risk of stroke.   S/p afib ablation 08/08/24 Patient in rapid atrial flutter today.  He has not converted with his PRN flecainide . We discussed rhythm control options. Will plan for DCCV. Recent labs reviewed.  Continue Xarelto  20 mg daily with no missed doses for 3 months post ablation.   Continue diltiazem  120 mg daily Continue flecainide  300 mg PRN for one dose, may be repeated every 4 days.     High Risk Medication Monitoring (ICD 10: [id number]) Patient requires ongoing monitoring for anti-arrhythmic medication which has the potential to cause life threatening arrhythmias. Intervals on ECG acceptable for flecainide  monitoring.     Obesity Body mass index is 35.28 kg/m.  Encouraged lifestyle modification   Follow up in the AF clinic post DCCV.    Informed Consent   Shared Decision Making/Informed Consent The risks (stroke, cardiac arrhythmias rarely resulting in the need for a temporary or permanent pacemaker, skin irritation or burns and complications associated with conscious sedation including aspiration, arrhythmia, respiratory failure and death), benefits (restoration of normal sinus rhythm) and alternatives of a direct current cardioversion were explained in detail to Ricky Arnold and he agrees to proceed.       Daril Kicks PA-C Af[facility] Clinic [facility] [address] [place], [place] [address] [phone] 08/12/2024 4:14 PM  "

## 2024-08-12 NOTE — H&P (View-Only) (Signed)
 "   Primary Care Physician: Pcp, No Primary Cardiologist: Dr Burnard (remotely) Primary Electrophysiologist: Dr Kennyth Referring Physician: Dr Burnard Drivers Ricky Arnold is a 59 y.o. male with a history of atrial fibrillation who presents for consultation in the [facility].  The patient was initially diagnosed with atrial fibrillation 08/2014 after presenting to the ED and was chemically converted with flecainide . Seen by Arland Don remotely. Patient has a CHADS2VASC score of 0. He has done well for several years using PIP flecainide . He typically has an episode between 1-3 years which responds quickly to flecainide . He was seen at [facility] 01/04/22 for EP evaluation but decided not to pursue treatment at that time. He was seen by Dr Kennyth and underwent afib ablation on 08/08/24.  Patient returns for follow up for atrial fibrillation. Patient called triage this AM with rapid heart rates. He was asymptomatic. Patient was instructed to take diltiazem  and flecainide . Unfortunately, he remains in what appears to be rapid atrial flutter. He first noticed elevated heart rates the day after his ablation. His groin sites are healing well. No missed doses of anticoagulation since ablation.   Today, he  denies symptoms of palpitations, chest pain, shortness of breath, orthopnea, PND, lower extremity edema, dizziness, presyncope, syncope, snoring, daytime somnolence, bleeding, or neurologic sequela. The patient is tolerating medications without difficulties and is otherwise without complaint today.    Atrial Fibrillation Risk Factors:  he does not have symptoms or diagnosis of sleep apnea. Sleep study 2016 he does not have a history of rheumatic fever. he does have a history of alcohol use. The patient does have a history of early familial atrial fibrillation or other arrhythmias.  Atrial Fibrillation Management history:  Previous antiarrhythmic drugs: flecainide   Previous  cardioversions: none Previous ablations: 08/08/24 Anticoagulation history: Xarelto    Past Medical History:  Diagnosis Date   Arrhythmia 08/20/14   afib    Current Outpatient Medications  Medication Sig Dispense Refill   Carboxymethylcellul-Glycerin (CLEAR EYES FOR DRY EYES OP) Place 1 drop into both eyes daily.     Cholecalciferol (VITAMIN D) 50 MCG (2000 UT) CAPS Take 2,000 Units by mouth daily.     Coenzyme Q10 (CO Q 10) 100 MG CAPS Take 100 mg by mouth every morning.     diltiazem  (CARDIZEM  CD) 120 MG 24 hr capsule Take 1 capsule (120 mg total) by mouth daily. 30 capsule 1   flecainide  (TAMBOCOR ) 150 MG tablet Take 2 tablets (300 mg total) by mouth once for 1 dose. May repeat every 4 days (Patient taking differently: Take 300 mg by mouth as needed (afib). May repeat every 4 days) 10 tablet 3   MAGNESIUM PO Take 500 mg by mouth daily.     Misc Natural Products (BEET ROOT PO) Take 1 Scoop by mouth daily. Super Beets     Multiple Vitamin (MULTIVITAMIN WITH MINERALS) TABS tablet Take 2 tablets by mouth daily. Pure Nature     OVER THE COUNTER MEDICATION Take 2 oz by mouth daily. Dose for your liver     rivaroxaban  (XARELTO ) 20 MG TABS tablet Take 1 tablet (20 mg total) by mouth daily with supper. 90 tablet 2   vitamin B-12 (CYANOCOBALAMIN) 100 MCG tablet Take 100 mcg by mouth daily.     No current facility-administered medications for this encounter.    ROS- All systems are reviewed and negative except as per the HPI above.  Physical Exam: Vitals:   08/12/24 1535  BP:  100/78  Pulse: (!) 146  SpO2: 97%  Weight: 105.2 kg  Height: 5' 8 (1.727 m)    GEN: Well nourished, well developed in no acute distress CARDIAC: Regular rhythm, tachycardia, no murmurs, rubs, gallops RESPIRATORY:  Clear to auscultation without rales, wheezing or rhonchi  ABDOMEN: Soft, non-tender, non-distended EXTREMITIES:  No edema; No deformity    Wt Readings from Last 3 Encounters:  08/12/24 105.2 kg   08/08/24 104.3 kg  06/19/24 108 kg    EKG Interpretation Date/Time:  Monday August 12 2024 15:33:12 EST Ventricular Rate:  146 PR Interval:  160 QRS Duration:  128 QT Interval:  280 QTC Calculation: 436 R Axis:   -47  Text Interpretation: Atrial flutter Left axis deviation Right bundle branch block Cannot rule out Inferior infarct , age undetermined Abnormal ECG When compared with ECG of 08-Aug-2024 09:24, Atrial flutter has replaced Sinus rhythm Confirmed by Hester Joslin ([id number]) on 08/12/2024 3:50:51 PM    Echo 07/30/24 demonstrated   1. Left ventricular ejection fraction, by estimation, is 70 to 75%. The  left ventricle has hyperdynamic function. The left ventricle has no  regional wall motion abnormalities. Left ventricular diastolic parameters  were normal.   2. Right ventricular systolic function is low normal. The right  ventricular size is mildly enlarged.   3. Left atrial size was mildly dilated.   4. The mitral valve is normal in structure. Trivial mitral valve  regurgitation.   5. The aortic valve is tricuspid. Aortic valve regurgitation is not  visualized.   6. The inferior vena cava is normal in size with greater than 50%  respiratory variability, suggesting right atrial pressure of 3 mmHg.   Epic records are reviewed at length today   CHA2DS2-VASc Score = 0  The patient's score is based upon: CHF History: 0 HTN History: 0 Diabetes History: 0 Stroke History: 0 Vascular Disease History: 0 Age Score: 0 Gender Score: 0       ASSESSMENT AND PLAN: Paroxysmal Atrial Fibrillation/atrial flutter (ICD10:  I48.0) The patient's CHA2DS2-VASc score is 0, indicating a 0.2% annual risk of stroke.   S/p afib ablation 08/08/24 Patient in rapid atrial flutter today.  He has not converted with his PRN flecainide . We discussed rhythm control options. Will plan for DCCV. Recent labs reviewed.  Continue Xarelto  20 mg daily with no missed doses for 3 months post ablation.   Continue diltiazem  120 mg daily Continue flecainide  300 mg PRN for one dose, may be repeated every 4 days.     High Risk Medication Monitoring (ICD 10: [id number]) Patient requires ongoing monitoring for anti-arrhythmic medication which has the potential to cause life threatening arrhythmias. Intervals on ECG acceptable for flecainide  monitoring.     Obesity Body mass index is 35.28 kg/m.  Encouraged lifestyle modification   Follow up in the AF clinic post DCCV.    Informed Consent   Shared Decision Making/Informed Consent The risks (stroke, cardiac arrhythmias rarely resulting in the need for a temporary or permanent pacemaker, skin irritation or burns and complications associated with conscious sedation including aspiration, arrhythmia, respiratory failure and death), benefits (restoration of normal sinus rhythm) and alternatives of a direct current cardioversion were explained in detail to Mr. Seckinger and he agrees to proceed.       Daril Kicks PA-C Afib Clinic [facility] [address] [place], [place] [address] [phone] 08/12/2024 4:14 PM  "

## 2024-08-12 NOTE — Telephone Encounter (Signed)
 Spoke with pt who reports last check HR remains at 160.  Pt reports he did take medications as recommended.  Pt has been scheduled for Afib clinic at 330pm today if he does not convert on his own.  Pt states he remains asymptomatic at this time.

## 2024-08-12 NOTE — Telephone Encounter (Signed)
 Patient c/o Palpitations: STAT if patient c/o lightheadedness, shortness of breath, or chest pain  How long have you had palpitations/irregular HR/ Afib? Are you having the symptoms now? Afib since Friday   Are you currently experiencing lightheadedness, SOB or CP? Nothing currently; states he had CP last night that he believes was related to indigestion, it has went away   Do you have a history of afib (atrial fibrillation) or irregular heart rhythm? Yes   4) Have you checked your BP or HR? (document readings if available): 166 - this morning   5) Are you experiencing any other symptoms?  No    STAT if HR is under 50 or over 120  (normal HR is 60-100 beats per minute)  What is your heart rate? 171 - currently   Do you have a log of your heart rate readings (document readings)? 166 earlier   Do you have any other symptoms? See above

## 2024-08-13 ENCOUNTER — Ambulatory Visit (HOSPITAL_COMMUNITY)
Admission: RE | Admit: 2024-08-13 | Discharge: 2024-08-13 | Disposition: A | Attending: Internal Medicine | Admitting: Internal Medicine

## 2024-08-13 ENCOUNTER — Ambulatory Visit (HOSPITAL_COMMUNITY): Admitting: Anesthesiology

## 2024-08-13 ENCOUNTER — Encounter (HOSPITAL_COMMUNITY): Payer: Self-pay | Admitting: Internal Medicine

## 2024-08-13 ENCOUNTER — Encounter (HOSPITAL_COMMUNITY): Admission: RE | Disposition: A | Payer: Self-pay | Source: Home / Self Care | Attending: Internal Medicine

## 2024-08-13 ENCOUNTER — Other Ambulatory Visit: Payer: Self-pay

## 2024-08-13 DIAGNOSIS — E669 Obesity, unspecified: Secondary | ICD-10-CM | POA: Diagnosis not present

## 2024-08-13 DIAGNOSIS — I4819 Other persistent atrial fibrillation: Secondary | ICD-10-CM | POA: Diagnosis present

## 2024-08-13 DIAGNOSIS — I4892 Unspecified atrial flutter: Secondary | ICD-10-CM | POA: Diagnosis not present

## 2024-08-13 DIAGNOSIS — Z6835 Body mass index (BMI) 35.0-35.9, adult: Secondary | ICD-10-CM | POA: Diagnosis not present

## 2024-08-13 DIAGNOSIS — Z7901 Long term (current) use of anticoagulants: Secondary | ICD-10-CM | POA: Insufficient documentation

## 2024-08-13 DIAGNOSIS — I484 Atypical atrial flutter: Secondary | ICD-10-CM

## 2024-08-13 DIAGNOSIS — Z79899 Other long term (current) drug therapy: Secondary | ICD-10-CM | POA: Diagnosis not present

## 2024-08-13 HISTORY — PX: CARDIOVERSION: EP1203

## 2024-08-13 MED ORDER — LIDOCAINE 2% (20 MG/ML) 5 ML SYRINGE
INTRAMUSCULAR | Status: DC | PRN
  Administered 2024-08-13: 100 mg via INTRAVENOUS

## 2024-08-13 MED ORDER — PROPOFOL 10 MG/ML IV BOLUS
INTRAVENOUS | Status: DC | PRN
  Administered 2024-08-13: 100 mg via INTRAVENOUS

## 2024-08-13 MED ORDER — SODIUM CHLORIDE 0.9 % IV SOLN: INTRAVENOUS | Status: DC

## 2024-08-13 NOTE — Anesthesia Postprocedure Evaluation (Signed)
"   Anesthesia Post Note  Patient: Ricky Arnold  Procedure(s) Performed: CARDIOVERSION     Patient location during evaluation: Cath Lab Anesthesia Type: General Level of consciousness: sedated and patient cooperative Pain management: pain level controlled Vital Signs Assessment: post-procedure vital signs reviewed and stable Respiratory status: spontaneous breathing Cardiovascular status: stable Anesthetic complications: no   No notable events documented.  Last Vitals:  Vitals:   08/13/24 1105 08/13/24 1110  BP: 107/85 108/79  Pulse: 86 90  Resp: 20 17  Temp:    SpO2: 93% 93%    Last Pain:  Vitals:   08/13/24 1100  TempSrc: Oral  PainSc: 0-No pain                 [name]      "

## 2024-08-13 NOTE — Anesthesia Preprocedure Evaluation (Addendum)
"                                    Anesthesia Evaluation  Patient identified by MRN, date of birth, ID band Patient awake    Reviewed: Allergy & Precautions, NPO status , Patient's Chart, lab work & pertinent test results  History of Anesthesia Complications Negative for: history of anesthetic complications  Airway Mallampati: II  TM Distance: >3 FB Neck ROM: Full    Dental  (+) Dental Advisory Given, Teeth Intact   Pulmonary neg pulmonary ROS, neg sleep apnea, neg COPD, Patient abstained from smoking.Not current smoker   Pulmonary exam normal breath sounds clear to auscultation       Cardiovascular Exercise Tolerance: Good METS(-) hypertension(-) CAD and (-) Past MI Normal cardiovascular exam+ dysrhythmias Atrial Fibrillation  Rhythm:Regular Rate:Normal  Echo 07/2024 1. Left ventricular ejection fraction, by estimation, is 70 to 75%. The left  ventricle has hyperdynamic function. The left ventricle has no regional  wall motion abnormalities. Left ventricular diastolic parameters were normal.   2. Right ventricular systolic function is low normal. The right  ventricular size is mildly enlarged.   3. Left atrial size was mildly dilated.   4. The mitral valve is normal in structure. Trivial mitral valve regurgitation.   5. The aortic valve is tricuspid. Aortic valve regurgitation is not visualized.   6. The inferior vena cava is normal in size with greater than 50%  respiratory variability, suggesting right atrial pressure of 3 mmHg.     Neuro/Psych negative neurological ROS  negative psych ROS   GI/Hepatic ,neg GERD  ,,(+)     (-) substance abuse    Endo/Other  neg diabetes    Renal/GU negative Renal ROS     Musculoskeletal   Abdominal  (+) + obese  Peds  Hematology   Anesthesia Other Findings   Reproductive/Obstetrics                              Anesthesia Physical Anesthesia Plan  ASA: 3  Anesthesia Plan:  General   Post-op Pain Management: Minimal or no pain anticipated   Induction: Intravenous  PONV Risk Score and Plan: 2 and Ondansetron , Propofol  infusion, TIVA and Treatment may vary due to age or medical condition  Airway Management Planned:   Additional Equipment: None  Intra-op Plan:   Post-operative Plan:   Informed Consent: I have reviewed the patients History and Physical, chart, labs and discussed the procedure including the risks, benefits and alternatives for the proposed anesthesia with the patient or authorized representative who has indicated his/her understanding and acceptance.     Dental advisory given  Plan Discussed with: CRNA  Anesthesia Plan Comments: (Risks of anesthesia explained at length. This includes, but is not limited to, sore throat, damage to teeth, lips gums, tongue and vocal cords, nausea and vomiting, reactions to medications, stroke, heart attack, and death. All patient questions were answered and the patient wishes to proceed. )         Anesthesia Quick Evaluation  "

## 2024-08-13 NOTE — Transfer of Care (Signed)
 Immediate Anesthesia Transfer of Care Note  Patient: Ricky Arnold  Procedure(s) Performed: CARDIOVERSION  Patient Location: PACU  Anesthesia Type:General  Level of Consciousness: awake, alert , and oriented  Airway & Oxygen Therapy: Patient Spontanous Breathing and Patient connected to nasal cannula oxygen  Post-op Assessment: Report given to RN and Post -op Vital signs reviewed and stable  Post vital signs: Reviewed and stable  Last Vitals:  Vitals Value Taken Time  BP    Temp    Pulse    Resp    SpO2      Last Pain:  Vitals:   08/13/24 1013  TempSrc: Temporal         Complications: No notable events documented.

## 2024-08-13 NOTE — CV Procedure (Signed)
 Post Cardioversion Procedure Note  Procedure: Electrical Cardioversion Indications:  Atrial Flutter  Procedure Details:  Consent: Risks of procedure as well as the alternatives and risks of each were explained to the (patient/caregiver).  Consent for procedure obtained. Patient has not skipped any doses of Xarelto  20 mg over the past 3 weeks.   Time Out: Verified patient identification, verified procedure, site/side was marked, verified correct patient position, special equipment/implants available, medications/allergies/relevent history reviewed, required imaging and test results available.  Performed  Patient placed on cardiac monitor, pulse oximetry, supplemental oxygen as necessary.  Sedation given by anesthesia team.  Pacer pads placed anterior and posterior chest.  Cardioverted 1 time(s).  Cardioversion with synchronized biphasic 200J shock.  Evaluation: Findings: Post procedure EKG shows: NSR Complications: None Patient did tolerate procedure well.  Time Spent Directly with the Patient:  24 minutes   Ricky Arnold Calender 08/13/2024, 10:56 AM

## 2024-08-13 NOTE — Interval H&P Note (Signed)
 History and Physical Interval Note:  08/13/2024 10:24 AM  Ricky Arnold  has presented today for surgery, with the diagnosis of afib.  The various methods of treatment have been discussed with the patient and family. After consideration of risks, benefits and other options for treatment, the patient has consented to  Procedures: CARDIOVERSION (N/A) as a surgical intervention.  The patient's history has been reviewed, patient examined, no change in status, stable for surgery.  I have reviewed the patient's chart and labs.  Questions were answered to the patient's satisfaction.     Emeline FORBES Calender

## 2024-08-16 ENCOUNTER — Telehealth: Payer: Self-pay | Admitting: Cardiology

## 2024-08-16 NOTE — Telephone Encounter (Signed)
" °  Pt c/o medication issue:  1. Name of Medication:   rivaroxaban  (XARELTO ) 20 MG TABS tablet    2. How are you currently taking this medication (dosage and times per day)? Take 1 tablet (20 mg total) by mouth daily with supper.   3. Are you having a reaction (difficulty breathing--STAT)? No   4. What is your medication issue? The patient forgot to take his Xarelto  last night. He was tired and exhausted from work and fell asleep. He wanted to know what he should do and whether he should double his dose today. If he is unable to answer his phone due to receiving many work-related calls, please contact his wife, Olam, to provide recommendations [phone]  "

## 2024-08-16 NOTE — Telephone Encounter (Signed)
 Spoke with the patient who states that he missed his xarelto  last night. He states that he has trouble remembering to take it at night even when he sets himself an alarm. He would like to know if he can switch to taking it in the mornings. Advised patient that as long as he is eating breakfast in the morning that will be fine. Patient will start today taking in the morning and proceed with that schedule.

## 2024-08-23 ENCOUNTER — Encounter (HOSPITAL_COMMUNITY): Payer: Self-pay | Admitting: Physician Assistant

## 2024-08-23 ENCOUNTER — Ambulatory Visit (HOSPITAL_COMMUNITY)
Admission: RE | Admit: 2024-08-23 | Discharge: 2024-08-23 | Disposition: A | Source: Ambulatory Visit | Attending: Physician Assistant | Admitting: Physician Assistant

## 2024-08-23 VITALS — BP 124/84 | HR 68 | Ht 68.0 in | Wt 238.2 lb

## 2024-08-23 DIAGNOSIS — D6869 Other thrombophilia: Secondary | ICD-10-CM | POA: Diagnosis not present

## 2024-08-23 DIAGNOSIS — I480 Paroxysmal atrial fibrillation: Secondary | ICD-10-CM

## 2024-08-23 NOTE — Progress Notes (Signed)
 "   Primary Care Physician: Pcp, No Primary Cardiologist: Dr Burnard (remotely) Primary Electrophysiologist: Dr Kennyth Referring Physician: Dr Burnard Drivers Ricky Arnold is a 59 y.o. male with a history of atrial fibrillation who presents for consultation in the [facility].  The patient was initially diagnosed with atrial fibrillation 08/2014 after presenting to the ED and was chemically converted with flecainide . Seen by Arland Don remotely. Patient has a CHADS2VASC score of 0. He has done well for several years using PIP flecainide . He typically has an episode between 1-3 years which responds quickly to flecainide . He was seen at [facility] 01/04/22 for EP evaluation but decided not to pursue treatment at that time. He was seen by Dr Kennyth and underwent afib ablation on 08/08/24.  Patient called triage 08/12/24 with rapid heart rates. He was asymptomatic. Patient was instructed to take diltiazem  and flecainide . Unfortunately, he remained in rapid atrial flutter. He first noticed elevated heart rates the day after his ablation.   Patient returns for follow up for atrial fibrillation. Discussed the use of AI scribe software for clinical note transcription with the patient, who gave verbal consent to proceed.  He underwent cardioversion on August 13, 2024, for atrial fibrillation. Since the procedure, he has not experienced any episodes of atrial fibrillation, although he occasionally feels brief 'pitter patters' lasting only a few seconds. These sensations resolve quickly without any associated pain or significant discomfort.  He has adjusted his Xarelto  dosing from evening to morning due to difficulty remembering to take it at night. He finds it easier to remember in the morning and takes it with a meal.   The sites of the ablation are healing well, with no pain and minimal bruising remaining.      Today, he  denies symptoms of chest pain, shortness of breath, orthopnea,  PND, lower extremity edema, dizziness, presyncope, syncope, snoring, daytime somnolence, bleeding, or neurologic sequela. The patient is tolerating medications without difficulties and is otherwise without complaint today.    Atrial Fibrillation Risk Factors:  he does not have symptoms or diagnosis of sleep apnea. Sleep study 2016 he does not have a history of rheumatic fever. he does have a history of alcohol use. The patient does have a history of early familial atrial fibrillation or other arrhythmias.  Atrial Fibrillation Management history:  Previous antiarrhythmic drugs: flecainide   Previous cardioversions: 08/13/24 Previous ablations: 08/08/24 Anticoagulation history: Xarelto    Past Medical History:  Diagnosis Date   Arrhythmia 08/20/14   afib    Current Outpatient Medications  Medication Sig Dispense Refill   Carboxymethylcellul-Glycerin (CLEAR EYES FOR DRY EYES OP) Place 1 drop into both eyes daily.     Cholecalciferol (VITAMIN D) 50 MCG (2000 UT) CAPS Take 2,000 Units by mouth daily.     Coenzyme Q10 (CO Q 10) 100 MG CAPS Take 100 mg by mouth every morning.     diltiazem  (CARDIZEM  CD) 120 MG 24 hr capsule Take 1 capsule (120 mg total) by mouth daily. 30 capsule 1   flecainide  (TAMBOCOR ) 150 MG tablet Take 2 tablets (300 mg total) by mouth once for 1 dose. May repeat every 4 days 10 tablet 3   MAGNESIUM PO Take 500 mg by mouth daily.     Misc Natural Products (BEET ROOT PO) Take 1 Scoop by mouth daily. Super Beets     Multiple Vitamin (MULTIVITAMIN WITH MINERALS) TABS tablet Take 2 tablets by mouth daily. Pure [employer]  THE COUNTER MEDICATION Take 2 oz by mouth daily. Dose for your liver     rivaroxaban  (XARELTO ) 20 MG TABS tablet Take 1 tablet (20 mg total) by mouth daily with supper. 90 tablet 2   vitamin B-12 (CYANOCOBALAMIN) 100 MCG tablet Take 100 mcg by mouth daily.     No current facility-administered medications for this encounter.    ROS- All systems are  reviewed and negative except as per the HPI above.  Physical Exam: Vitals:   08/23/24 1535  BP: 124/84  Pulse: 68  Weight: 108 kg  Height: 5' 8 (1.727 m)     GEN: Well nourished, well developed in no acute distress CARDIAC: Regular rate and rhythm, no murmurs, rubs, gallops RESPIRATORY:  Clear to auscultation without rales, wheezing or rhonchi  ABDOMEN: Soft, non-tender, non-distended EXTREMITIES:  No edema; No deformity    Wt Readings from Last 3 Encounters:  08/23/24 108 kg  08/12/24 105.2 kg  08/08/24 104.3 kg    EKG Interpretation Date/Time:  Friday August 23 2024 15:37:52 EST Ventricular Rate:  68 PR Interval:  156 QRS Duration:  98 QT Interval:  378 QTC Calculation: 401 R Axis:   4  Text Interpretation: Normal sinus rhythm Incomplete right bundle branch block Possible Inferior infarct , age undetermined Abnormal ECG When compared with ECG of 13-Aug-2024 10:59, No significant change was found Confirmed by Alean Kromer ([id number]) on 08/23/2024 3:38:54 PM    Echo 07/30/24 demonstrated   1. Left ventricular ejection fraction, by estimation, is 70 to 75%. The  left ventricle has hyperdynamic function. The left ventricle has no  regional wall motion abnormalities. Left ventricular diastolic parameters  were normal.   2. Right ventricular systolic function is low normal. The right  ventricular size is mildly enlarged.   3. Left atrial size was mildly dilated.   4. The mitral valve is normal in structure. Trivial mitral valve  regurgitation.   5. The aortic valve is tricuspid. Aortic valve regurgitation is not  visualized.   6. The inferior vena cava is normal in size with greater than 50%  respiratory variability, suggesting right atrial pressure of 3 mmHg.   Epic records are reviewed at length today   CHA2DS2-VASc Score = 0  The patient's score is based upon: CHF History: 0 HTN History: 0 Diabetes History: 0 Stroke History: 0 Vascular Disease History: 0 Age  Score: 0 Gender Score: 0       ASSESSMENT AND PLAN: Persistent Atrial Fibrillation/atrial flutter (ICD10:  I48.19) The patient's CHA2DS2-VASc score is 0, indicating a 0.2% annual risk of stroke.   S/p afib ablation 08/08/24 S/p DCCV 08/13/24 Patient appears to be maintaining SR Continue diltiazem  120 mg daily for now.  Continue Xarelto  20 mg daily with no missed doses for 3 months post ablation.  Continue flecainide  300 mg PRN q 4 days for afib.    High Risk Medication Monitoring (ICD 10: [id number]) Patient requires ongoing monitoring for anti-arrhythmic medication which has the potential to cause life threatening arrhythmias. Intervals on ECG acceptable for flecainide  monitoring.     Obesity Body mass index is 36.22 kg/m.  Encouraged lifestyle modification Patient plans to start an exercise regimen.    Follow up in the AF clinic in April as scheduled.    Daril Kicks PA-C Afib Clinic [facility] [address] [place], [place] [address] [phone] 08/23/2024 3:49 PM  "

## 2024-09-05 ENCOUNTER — Ambulatory Visit (HOSPITAL_COMMUNITY): Admitting: Physician Assistant

## 2024-11-04 ENCOUNTER — Ambulatory Visit (HOSPITAL_COMMUNITY): Admitting: Physician Assistant

## 2024-11-06 ENCOUNTER — Ambulatory Visit: Admitting: Student
